# Patient Record
Sex: Male | Born: 1995 | Race: White | Hispanic: No | Marital: Single
Health system: Southern US, Community
[De-identification: ages and names within clinical notes are randomized; demographics above are authoritative.]

## PROBLEM LIST (undated history)

## (undated) DIAGNOSIS — F1010 Alcohol abuse, uncomplicated: Secondary | ICD-10-CM

## (undated) DIAGNOSIS — F4010 Social phobia, unspecified: Secondary | ICD-10-CM

---
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
## 2016-01-11 ENCOUNTER — Ambulatory Visit (HOSPITAL_COMMUNITY)
Admission: EM | Admit: 2016-01-11 | Discharge: 2016-01-11 | Disposition: A | Discharge status: Home / Self Care | Payer: Medicaid Other | Attending: Family Medicine | Admitting: Family Medicine

## 2016-01-11 ENCOUNTER — Encounter (HOSPITAL_COMMUNITY): Payer: Self-pay | Admitting: Emergency Medicine

## 2016-01-11 DIAGNOSIS — H6983 Other specified disorders of Eustachian tube, bilateral: Secondary | ICD-10-CM | POA: Insufficient documentation

## 2016-01-11 DIAGNOSIS — J029 Acute pharyngitis, unspecified: Secondary | ICD-10-CM | POA: Insufficient documentation

## 2016-01-11 DIAGNOSIS — T700XXA Otitic barotrauma, initial encounter: Secondary | ICD-10-CM | POA: Diagnosis not present

## 2016-01-11 LAB — POCT RAPID STREP A: STREPTOCOCCUS, GROUP A SCREEN (DIRECT): NEGATIVE

## 2016-01-11 MED ORDER — AMOXICILLIN 500 MG PO CAPS: 1000.0000 mg | ORAL_CAPSULE | Freq: Two times a day (BID) | ORAL | 0 refills | Status: AC

## 2016-01-11 NOTE — ED Provider Notes (Signed)
CSN: [id number]     Arrival date & time 01/11/16  1951 History   None    Chief Complaint  Patient presents with  . Sore Throat   (Consider location/radiation/quality/duration/timing/severity/associated sxs/prior Treatment) 20 year old male brought in by the mother complaining of a sore throat for one week. He states after first with 3 days it was getting better until the last day and a half then the throat pain got much worse. He was swallowing and there is pain to the left anterior neck that radiates to the side of the face into the ear. This is worse when opening the mouth or chewing. He has had a low-grade fever. He is also complaining of epistaxis. He has had 4 episodes of nosebleeds in the past several weeks. He had one this week.      History reviewed. No pertinent past medical history. History reviewed. No pertinent surgical history. No family history on file. Social History  Substance Use Topics  . Smoking status: Never Smoker  . Smokeless tobacco: Never Used  . Alcohol use No    Review of Systems  Constitutional: Positive for activity change and fever.  HENT: Positive for congestion, ear pain, nosebleeds, sore throat and trouble swallowing.   Eyes: Negative.   Respiratory: Negative.   Cardiovascular: Negative for chest pain.  Gastrointestinal: Negative.   Skin: Negative.   Neurological: Negative.   All other systems reviewed and are negative.   Allergies  Review of patient's allergies indicates no known allergies.  Home Medications   Prior to Admission medications   Medication Sig Start Date End Date Taking? Authorizing Provider  acetaminophen (TYLENOL) 325 MG tablet Take 325 mg by mouth every 6 (six) hours as needed.    Historical Provider, MD  amoxicillin (AMOXIL) 500 MG capsule Take 2 capsules (1,000 mg total) by mouth 2 (two) times daily. 01/11/16   Hayden Rasmussen, NP  ibuprofen (ADVIL,MOTRIN) 200 MG tablet Take 200 mg by mouth every 6 (six) hours as needed.     Historical Provider, MD   Meds Ordered and Administered this Visit  Medications - No data to display  BP 151/75 (BP Location: Left Arm)   Pulse 78   Temp 99.3 F (37.4 C) (Oral)   Resp 16   SpO2 100%  No data found.   Physical Exam  Constitutional: He is oriented to person, place, and time. He appears well-developed and well-nourished. No distress.  HENT:  Head: Normocephalic and atraumatic.  Oropharynx with generalized deep erythema. Few small gray exudates.  Bilateral TMs retracted. No erythema or effusion.  Bilateral nasal turbinates are boggy and erythematous swollen. No site for bleeding.  Eyes: EOM are normal.  Neck: Normal range of motion. Neck supple.  Cardiovascular: Normal rate and regular rhythm.   Pulmonary/Chest: Effort normal.  Musculoskeletal: Normal range of motion. He exhibits no edema or deformity.  Lymphadenopathy:    He has cervical adenopathy.  Neurological: He is alert and oriented to person, place, and time.  Skin: Skin is warm and dry.  Psychiatric: He has a normal mood and affect.  Nursing note and vitals reviewed.   Urgent Care Course   Clinical Course    Procedures (including critical care time)  Labs Review Labs Reviewed  POCT RAPID STREP A    Imaging Review No results found.   Visual Acuity Review  Right Eye Distance:   Left Eye Distance:   Bilateral Distance:    Right Eye Near:   Left Eye Near:  Bilateral Near:         MDM   1. Pharyngitis   2. Barotitis media, initial encounter   3. ETD (eustachian tube dysfunction), bilateral    Use copious amounts of saline nasal spray frequently. To prevent nose bleeds use Neosynephrine 1% nasal spray 4 times a day for 3-4 days and for nose bleeds. Drink lots of fluids Sudafed PE 10 mg every 4 hr as needed for congestion. Ibuprofen 600 mg every 6 hours as needed Meds ordered this encounter  Medications  . amoxicillin (AMOXIL) 500 MG capsule    Sig: Take 2 capsules  (1,000 mg total) by mouth 2 (two) times daily.    Dispense:  40 capsule    Refill:  0    Order Specific Question:   Supervising Provider    Answer:   Linna HoffKINDL, JAMES D [[id number]]       Hayden Rasmussenavid Linus Weckerly, NP 01/11/16 2108    Hayden Rasmussenavid Rameses Ou, NP 01/11/16 2109

## 2016-01-11 NOTE — Discharge Instructions (Signed)
Use copious amounts of saline nasal spray frequently. To prevent nose bleeds use Neosynephrine 1% nasal spray 4 times a day for 3-4 days and for nose bleeds. Drink lots of fluids Sudafed PE 10 mg every 4 hr as needed for congestion. Ibuprofen 600 mg every 6 hours as needed

## 2016-01-11 NOTE — ED Triage Notes (Signed)
Pt c/o ST onset 8/26 associated w/odynophagia and left ear pain  Taking OTC ibup/aceta w/n relief.   A&O x4... NAD

## 2016-01-14 LAB — CULTURE, GROUP A STREP (THRC)

## 2017-07-17 ENCOUNTER — Encounter (HOSPITAL_COMMUNITY): Payer: Self-pay

## 2017-07-17 ENCOUNTER — Emergency Department (HOSPITAL_COMMUNITY)
Admission: EM | Admit: 2017-07-17 | Discharge: 2017-07-17 | Disposition: A | Discharge status: Home / Self Care | Payer: Medicaid Other | Attending: Emergency Medicine | Admitting: Emergency Medicine

## 2017-07-17 ENCOUNTER — Emergency Department (HOSPITAL_COMMUNITY): Payer: Medicaid Other

## 2017-07-17 ENCOUNTER — Other Ambulatory Visit: Payer: Self-pay

## 2017-07-17 DIAGNOSIS — W228XXA Striking against or struck by other objects, initial encounter: Secondary | ICD-10-CM | POA: Insufficient documentation

## 2017-07-17 DIAGNOSIS — Y9389 Activity, other specified: Secondary | ICD-10-CM | POA: Diagnosis not present

## 2017-07-17 DIAGNOSIS — Z23 Encounter for immunization: Secondary | ICD-10-CM | POA: Diagnosis not present

## 2017-07-17 DIAGNOSIS — S61012A Laceration without foreign body of left thumb without damage to nail, initial encounter: Secondary | ICD-10-CM | POA: Insufficient documentation

## 2017-07-17 DIAGNOSIS — Y998 Other external cause status: Secondary | ICD-10-CM | POA: Diagnosis not present

## 2017-07-17 DIAGNOSIS — Y929 Unspecified place or not applicable: Secondary | ICD-10-CM | POA: Diagnosis not present

## 2017-07-17 DIAGNOSIS — S60419A Abrasion of unspecified finger, initial encounter: Secondary | ICD-10-CM

## 2017-07-17 MED ORDER — TETANUS-DIPHTH-ACELL PERTUSSIS 5-2.5-18.5 LF-MCG/0.5 IM SUSP
0.5000 mL | Freq: Once | INTRAMUSCULAR | Status: AC
  Administered 2017-07-17: 0.5 mL via INTRAMUSCULAR
  Filled 2017-07-17: qty 0.5

## 2017-07-17 NOTE — Discharge Instructions (Signed)
Keep the wound clean and dry for the first 24 hours. You may shower but do not immerse the wound in water.  Make sure to pat dry the wound before covering it with any dressing. As we discussed, the glue will fall off on its own. You can use topical antibiotic ointment and bandage. Ice and elevate for pain relief.   You can take Tylenol or Ibuprofen as directed for pain. You can alternate Tylenol and Ibuprofen every 4 hours for additional pain relief.   When you return to work, wear gloves for the next few days.   Monitor closely for any signs of infection. Return to the Emergency Department for any worsening redness/swelling of the area that begins to spread, drainage from the site, worsening pain, fever or any other worsening or concerning symptoms.

## 2017-07-17 NOTE — ED Provider Notes (Addendum)
[facility]-EMERGENCY DEPT Provider Note   CSN: [id number] Arrival date & time: 07/17/17  1721     History   Chief Complaint Chief Complaint  Patient presents with  . Laceration    HPI Jeffery Wallace is a 22 y.o. male who presents for evaluation of injury to the left thumb that occurred just prior to arrival.  Patient reports that he was fixing a tire when the piece that he was using to plug the tire fell and landed on his left thumb.  He reports a small laceration noted to the distal aspect of the thumb.  He does not know when his last tetanus shot was.  He denies any numbness/weakness.  The history is provided by the patient.    History reviewed. No pertinent past medical history.  There are no active problems to display for this patient.   History reviewed. No pertinent surgical history.     Home Medications    Prior to Admission medications   Medication Sig Start Date End Date Taking? Authorizing Provider  amoxicillin (AMOXIL) 500 MG capsule Take 2 capsules (1,000 mg total) by mouth 2 (two) times daily. 01/11/16   Hayden RasmussenMabe, David, NP    Family History Family History  Problem Relation Age of Onset  . Heart murmur Mother   . Scoliosis Mother     Social History Social History   Tobacco Use  . Smoking status: Never Smoker  . Smokeless tobacco: Never Used  Substance Use Topics  . Alcohol use: No  . Drug use: No     Allergies   Patient has no known allergies.   Review of Systems Review of Systems  Skin: Positive for wound.  Neurological: Negative for weakness and numbness.     Physical Exam Updated Vital Signs BP 137/68 (BP Location: Right Arm)   Pulse 71   Temp 98.4 F (36.9 C) (Oral)   Resp 14   Ht 6\' 2"  (1.88 m)   Wt 108.9 kg (240 lb)   SpO2 98%   BMI 30.81 kg/m   Physical Exam  Constitutional: He appears well-developed and well-nourished.  HENT:  Head: Normocephalic and atraumatic.  Eyes: Conjunctivae and EOM are  normal. Right eye exhibits no discharge. Left eye exhibits no discharge. No scleral icterus.  Cardiovascular:  Pulses:      Radial pulses are 2+ on the right side, and 2+ on the left side.  Pulmonary/Chest: Effort normal.  Musculoskeletal:  Mild tenderness to palpation to the distal left thumb.  No deformity or crepitus noted.  Abduction and abduction of the left thumb intact without any difficulty.  Full range of motion of all 5 digits of left hand intact without any difficulty.  No tenderness palpation of the left wrist.  Neurological: He is alert.  Skin: Skin is warm and dry. Capillary refill takes less than 2 seconds.  Facial skin abrasion versus avulsion to the distal aspect of the left thumb.  No damage to left nail. Good distal cap refill. LUE is not dusky in appearance or cool to touch.  Psychiatric: He has a normal mood and affect. His speech is normal and behavior is normal.  Nursing note and vitals reviewed.    ED Treatments / Results  Labs (all labs ordered are listed, but only abnormal results are displayed) Labs Reviewed - No data to display  EKG  EKG Interpretation None       Radiology Dg Hand Complete Left  Result Date: 07/17/2017 CLINICAL DATA:  Left thumb laceration.  Initial encounter. EXAM: LEFT HAND - COMPLETE 3+ VIEW COMPARISON:  None. FINDINGS: Thumb laceration at the tip. Negative for opaque foreign body or fracture. Normal alignment. IMPRESSION: Negative for opaque foreign body or fracture. Electronically Signed   By: Marnee Spring M.D.   On: 07/17/2017 18:56    Procedures .Marland KitchenLaceration Repair Date/Time: 07/17/2017 8:15 PM Performed by: Maxwell Caul, PA-C Authorized by: Maxwell Caul, PA-C   Consent:    Consent obtained:  Verbal   Consent given by:  Patient   Risks discussed:  Infection, pain and poor wound healing Anesthesia (see MAR for exact dosages):    Anesthesia method:  None Laceration details:    Location:  Finger   Finger  location:  L thumb Repair type:    Repair type:  Simple Pre-procedure details:    Preparation:  Patient was prepped and draped in usual sterile fashion Treatment:    Area cleansed with:  Saline   Amount of cleaning:  Extensive   Irrigation solution:  Sterile saline   Irrigation method:  Syringe Skin repair:    Repair method:  Tissue adhesive Approximation:    Approximation:  Close   Vermilion border: well-aligned       Patient with a 1 cm abrasion/skin avulsion. Repaired with skin glue.    (including critical care time)  Medications Ordered in ED Medications  Tdap (BOOSTRIX) injection 0.5 mL (0.5 mLs Intramuscular Given 07/17/17 1935)     Initial Impression / Assessment and Plan / ED Course  I have reviewed the triage vital signs and the nursing notes.  Pertinent labs & imaging results that were available during my care of the patient were reviewed by me and considered in my medical decision making (see chart for details).     22 year old male who presents for evaluation of left thumb injury that occurred earlier this afternoon.  States that he was changing a tire when a piece of the tire machinery, fell and hit his thumb.  Tetanus is not up-to-date. Patient is afebrile, non-toxic appearing, sitting comfortably on examination table. Vital signs reviewed and stable. Patient is neurovascularly intact.  He initially appear to have a small ulcer, linear laceration noted to the distal aspect of the left thumb.  After wound cleaning, this appeared to be more of a abrasion/avulsion of the superficial layer of the skin.  After thorough wound clinic, did not appear to extend deeply.  Will plan for x-ray evaluation.  X-ray reviewed.  Negative for any acute fracture dislocation.  Discussed results with patient.  Discussed treatment options, including Dermabond versus clipping the avulsed skin.  Patient opted for Dermabond.  Dermabond applied without any difficulty.  Wound care precautions  discussed with patient. Patient had ample opportunity for questions and discussion. All patient's questions were answered with full understanding. Strict return precautions discussed. Patient expresses understanding and agreement to plan.    Final Clinical Impressions(s) / ED Diagnoses   Final diagnoses:  Abrasion of finger, initial encounter    ED Discharge Orders    None       Rosana Hoes 07/17/17 2047    Bethann Berkshire, MD 07/17/17 2246    Maxwell Caul, PA-C 07/30/17 1546    Bethann Berkshire, MD 07/30/17 2108

## 2017-07-17 NOTE — ED Notes (Signed)
Pt's wound wrapped with telfa, then covered with coband

## 2017-07-17 NOTE — ED Triage Notes (Signed)
Patient cut his left thumb trying to plug a tire.

## 2018-05-11 ENCOUNTER — Emergency Department (HOSPITAL_COMMUNITY): Payer: Medicaid Other

## 2018-05-11 ENCOUNTER — Inpatient Hospital Stay (HOSPITAL_COMMUNITY)
Admission: EM | Admit: 2018-05-11 | Discharge: 2018-05-14 | DRG: 511 | Disposition: A | Discharge status: Home Health | Payer: Medicaid Other | Attending: Orthopedic Surgery | Admitting: Orthopedic Surgery

## 2018-05-11 DIAGNOSIS — S52612A Displaced fracture of left ulna styloid process, initial encounter for closed fracture: Secondary | ICD-10-CM | POA: Diagnosis present

## 2018-05-11 DIAGNOSIS — S2242XA Multiple fractures of ribs, left side, initial encounter for closed fracture: Secondary | ICD-10-CM | POA: Diagnosis present

## 2018-05-11 DIAGNOSIS — Y92410 Unspecified street and highway as the place of occurrence of the external cause: Secondary | ICD-10-CM | POA: Diagnosis not present

## 2018-05-11 DIAGNOSIS — J984 Other disorders of lung: Secondary | ICD-10-CM | POA: Diagnosis present

## 2018-05-11 DIAGNOSIS — S62022A Displaced fracture of middle third of navicular [scaphoid] bone of left wrist, initial encounter for closed fracture: Secondary | ICD-10-CM | POA: Diagnosis present

## 2018-05-11 DIAGNOSIS — M25532 Pain in left wrist: Secondary | ICD-10-CM | POA: Diagnosis present

## 2018-05-11 DIAGNOSIS — T148XXA Other injury of unspecified body region, initial encounter: Secondary | ICD-10-CM

## 2018-05-11 DIAGNOSIS — S2239XA Fracture of one rib, unspecified side, initial encounter for closed fracture: Secondary | ICD-10-CM | POA: Diagnosis present

## 2018-05-11 DIAGNOSIS — S52502A Unspecified fracture of the lower end of left radius, initial encounter for closed fracture: Principal | ICD-10-CM | POA: Diagnosis present

## 2018-05-11 DIAGNOSIS — S62002A Unspecified fracture of navicular [scaphoid] bone of left wrist, initial encounter for closed fracture: Secondary | ICD-10-CM

## 2018-05-11 LAB — I-STAT CHEM 8, ED
BUN: 16 mg/dL (ref 6–20)
Calcium, Ion: 1.13 mmol/L — ABNORMAL LOW (ref 1.15–1.40)
Chloride: 101 mmol/L (ref 98–111)
Creatinine, Ser: 1.2 mg/dL (ref 0.61–1.24)
GLUCOSE: 104 mg/dL — AB (ref 70–99)
HCT: 48 % (ref 39.0–52.0)
Hemoglobin: 16.3 g/dL (ref 13.0–17.0)
Potassium: 3.9 mmol/L (ref 3.5–5.1)
Sodium: 137 mmol/L (ref 135–145)
TCO2: 25 mmol/L (ref 22–32)

## 2018-05-11 LAB — CBC
HCT: 47.7 % (ref 39.0–52.0)
Hemoglobin: 16.2 g/dL (ref 13.0–17.0)
MCH: 28.8 pg (ref 26.0–34.0)
MCHC: 34 g/dL (ref 30.0–36.0)
MCV: 84.7 fL (ref 80.0–100.0)
Platelets: 286 10*3/uL (ref 150–400)
RBC: 5.63 MIL/uL (ref 4.22–5.81)
RDW: 12 % (ref 11.5–15.5)
WBC: 10.5 10*3/uL (ref 4.0–10.5)
nRBC: 0 % (ref 0.0–0.2)

## 2018-05-11 LAB — COMPREHENSIVE METABOLIC PANEL
ALT: 21 U/L (ref 0–44)
AST: 32 U/L (ref 15–41)
Albumin: 5.2 g/dL — ABNORMAL HIGH (ref 3.5–5.0)
Alkaline Phosphatase: 71 U/L (ref 38–126)
Anion gap: 13 (ref 5–15)
BUN: 14 mg/dL (ref 6–20)
CO2: 25 mmol/L (ref 22–32)
Calcium: 10.1 mg/dL (ref 8.9–10.3)
Chloride: 100 mmol/L (ref 98–111)
Creatinine, Ser: 1.28 mg/dL — ABNORMAL HIGH (ref 0.61–1.24)
GFR calc Af Amer: >60 mL/min (ref >60)
GFR calc non Af Amer: >60 mL/min (ref >60)
Glucose, Bld: 110 mg/dL — ABNORMAL HIGH (ref 70–99)
Potassium: 4 mmol/L (ref 3.5–5.1)
Sodium: 138 mmol/L (ref 135–145)
Total Bilirubin: 0.7 mg/dL (ref 0.3–1.2)
Total Protein: 7.8 g/dL (ref 6.5–8.1)

## 2018-05-11 LAB — PROTIME-INR
INR: 1.13
Prothrombin Time: 14.4 seconds (ref 11.4–15.2)

## 2018-05-11 LAB — CDS SEROLOGY

## 2018-05-11 LAB — SAMPLE TO BLOOD BANK

## 2018-05-11 LAB — I-STAT CG4 LACTIC ACID, ED: Lactic Acid, Venous: 2.62 mmol/L (ref 0.5–1.9)

## 2018-05-11 LAB — ETHANOL: Alcohol, Ethyl (B): <10 mg/dL (ref <10)

## 2018-05-11 MED ORDER — HYDROMORPHONE HCL 1 MG/ML IJ SOLN: 1.0000 mg | INTRAMUSCULAR | Status: DC | PRN

## 2018-05-11 MED ORDER — KCL IN DEXTROSE-NACL 20-5-0.9 MEQ/L-%-% IV SOLN
INTRAVENOUS | Status: DC
  Administered 2018-05-12 – 2018-05-13 (×3): via INTRAVENOUS
  Filled 2018-05-11 (×3): qty 1000

## 2018-05-11 MED ORDER — ENOXAPARIN SODIUM 40 MG/0.4ML ~~LOC~~ SOLN
40.0000 mg | SUBCUTANEOUS | Status: DC
  Administered 2018-05-12 – 2018-05-14 (×3): 40 mg via SUBCUTANEOUS
  Filled 2018-05-11 (×3): qty 0.4

## 2018-05-11 MED ORDER — TETANUS-DIPHTH-ACELL PERTUSSIS 5-2.5-18.5 LF-MCG/0.5 IM SUSP
0.5000 mL | Freq: Once | INTRAMUSCULAR | Status: AC
  Administered 2018-05-11: 0.5 mL via INTRAMUSCULAR

## 2018-05-11 MED ORDER — ONDANSETRON HCL 4 MG/2ML IJ SOLN: 4.0000 mg | Freq: Four times a day (QID) | INTRAMUSCULAR | Status: DC | PRN

## 2018-05-11 MED ORDER — LORAZEPAM 2 MG/ML IJ SOLN
INTRAMUSCULAR | Status: AC
  Filled 2018-05-11: qty 1

## 2018-05-11 MED ORDER — IOHEXOL 300 MG/ML  SOLN
100.0000 mL | Freq: Once | INTRAMUSCULAR | Status: AC | PRN
  Administered 2018-05-11: 100 mL via INTRAVENOUS

## 2018-05-11 MED ORDER — OXYCODONE HCL 5 MG PO TABS
5.0000 mg | ORAL_TABLET | ORAL | Status: DC | PRN
  Administered 2018-05-12 – 2018-05-13 (×3): 5 mg via ORAL
  Filled 2018-05-11 (×3): qty 1

## 2018-05-11 MED ORDER — LORAZEPAM 2 MG/ML IJ SOLN
1.0000 mg | Freq: Once | INTRAMUSCULAR | Status: AC
  Administered 2018-05-11: 1 mg via INTRAVENOUS

## 2018-05-11 MED ORDER — HYDRALAZINE HCL 20 MG/ML IJ SOLN: 10.0000 mg | INTRAMUSCULAR | Status: DC | PRN

## 2018-05-11 MED ORDER — TETANUS-DIPHTH-ACELL PERTUSSIS 5-2.5-18.5 LF-MCG/0.5 IM SUSP
INTRAMUSCULAR | Status: AC
  Filled 2018-05-11: qty 0.5

## 2018-05-11 MED ORDER — ONDANSETRON 4 MG PO TBDP: 4.0000 mg | ORAL_TABLET | Freq: Four times a day (QID) | ORAL | Status: DC | PRN

## 2018-05-11 MED ORDER — SODIUM CHLORIDE 0.9 % IV BOLUS
1000.0000 mL | Freq: Once | INTRAVENOUS | Status: AC
  Administered 2018-05-11: 1000 mL via INTRAVENOUS

## 2018-05-11 NOTE — Progress Notes (Signed)
   05/11/18 1900  Clinical Encounter Type  Visited With Patient and family together;Patient;Health care provider  Visit Type Initial;Social support;Psychological support;ED;Trauma  Referral From Other (Comment) (level 2 trauma lplg)  Recommendations could use support for social anxiety  Spiritual Encounters  Spiritual Needs Emotional;Grief support  Stress Factors  Patient Stress Factors Loss of control;Major life changes;Family relationships;Financial concerns;Health changes   Met pt, at his request helped him use his cell phone to call his grandmother, Bethena Roys.  She answered and spoke w/ pt, knows he is here.    Pt is expressing numerous worries and concerns, from past difficult situations (including his mother's death in hospital 2 years ago) to financial concerns, worries about his older brother's problems, his great-grandmother, previous experiences in his teenage years.  He also states that he "barely leaves" his home, "doesn't have any friends," and that he has "social anxiety."  Chaplain stayed w/ pt until just after his brother arrived, about an hour.  Pt very self-critical.  Pt lives w/ and cares for his 65 yo great-grandmother.  He states he dropped out of school at age 63.    Pt's brother arrived.  Chaplain referred questions he had to the medical staff.  [name] resident, [phone]
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
## 2018-05-11 NOTE — ED Notes (Signed)
Pt given 100 mcg fentanyl, left arm splinted at the wrist. Abrasions noted to the right arm and elbow.  No abdominal tenderness or rigidity.

## 2018-05-11 NOTE — ED Notes (Signed)
Lindsay(Phleb) notified Dr. Clarene DukeLittle of critical lab result
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
## 2018-05-11 NOTE — Anesthesia Preprocedure Evaluation (Addendum)
Anesthesia Evaluation  Patient identified by MRN, date of birth, ID band Patient awake    Reviewed: Allergy & Precautions, H&P , NPO status , Patient's Chart, lab work & pertinent test results  Airway Mallampati: II  TM Distance: >3 FB Neck ROM: Full    Dental no notable dental hx. (+) Teeth Intact, Dental Advisory Given   Pulmonary neg pulmonary ROS,    Pulmonary exam normal breath sounds clear to auscultation       Cardiovascular negative cardio ROS   Rhythm:Regular Rate:Normal     Neuro/Psych negative neurological ROS  negative psych ROS   GI/Hepatic negative GI ROS, Neg liver ROS,   Endo/Other  negative endocrine ROS  Renal/GU negative Renal ROS  negative genitourinary   Musculoskeletal   Abdominal   Peds  Hematology negative hematology ROS (+)   Anesthesia Other Findings   Reproductive/Obstetrics negative OB ROS                             Anesthesia Physical Anesthesia Plan  ASA: I  Anesthesia Plan: General   Post-op Pain Management:    Induction: Intravenous, Rapid sequence and Cricoid pressure planned  PONV Risk Score and Plan: 3 and Ondansetron, Dexamethasone and Midazolam  Airway Management Planned: Oral ETT  Additional Equipment:   Intra-op Plan:   Post-operative Plan: Extubation in OR  Informed Consent: I have reviewed the patients History and Physical, chart, labs and discussed the procedure including the risks, benefits and alternatives for the proposed anesthesia with the patient or authorized representative who has indicated his/her understanding and acceptance.     Dental advisory given  Plan Discussed with: CRNA  Anesthesia Plan Comments:         Anesthesia Quick Evaluation  

## 2018-05-12 ENCOUNTER — Inpatient Hospital Stay (HOSPITAL_COMMUNITY): Payer: Medicaid Other | Admitting: Anesthesiology

## 2018-05-12 ENCOUNTER — Encounter (HOSPITAL_COMMUNITY): Admission: EM | Disposition: A | Discharge status: Home Health | Payer: Self-pay | Source: Home / Self Care

## 2018-05-12 ENCOUNTER — Inpatient Hospital Stay (HOSPITAL_COMMUNITY): Payer: Medicaid Other

## 2018-05-12 HISTORY — PX: ORIF RADIAL FRACTURE: SHX5113

## 2018-05-12 LAB — HIV ANTIBODY (ROUTINE TESTING W REFLEX): HIV Screen 4th Generation wRfx: NONREACTIVE

## 2018-05-12 SURGERY — OPEN REDUCTION INTERNAL FIXATION (ORIF) RADIAL FRACTURE
Anesthesia: General | Site: Hand | Laterality: Left

## 2018-05-12 MED ORDER — MIDAZOLAM HCL 5 MG/5ML IJ SOLN
INTRAMUSCULAR | Status: DC | PRN
  Administered 2018-05-12: 2 mg via INTRAVENOUS

## 2018-05-12 MED ORDER — LIDOCAINE 2% (20 MG/ML) 5 ML SYRINGE
INTRAMUSCULAR | Status: DC | PRN
  Administered 2018-05-12: 60 mg via INTRAVENOUS

## 2018-05-12 MED ORDER — FENTANYL CITRATE (PF) 100 MCG/2ML IJ SOLN
INTRAMUSCULAR | Status: DC | PRN
  Administered 2018-05-12 (×5): 50 ug via INTRAVENOUS

## 2018-05-12 MED ORDER — SUCCINYLCHOLINE CHLORIDE 200 MG/10ML IV SOSY
PREFILLED_SYRINGE | INTRAVENOUS | Status: DC | PRN
  Administered 2018-05-12: 100 mg via INTRAVENOUS

## 2018-05-12 MED ORDER — PROPOFOL 10 MG/ML IV BOLUS: INTRAVENOUS | Status: DC | PRN

## 2018-05-12 MED ORDER — BUPIVACAINE HCL (PF) 0.25 % IJ SOLN
INTRAMUSCULAR | Status: DC | PRN
  Administered 2018-05-12: 10 mL

## 2018-05-12 MED ORDER — ACETAMINOPHEN 500 MG PO TABS: 1000.0000 mg | ORAL_TABLET | Freq: Four times a day (QID) | ORAL | Status: AC | PRN

## 2018-05-12 MED ORDER — LIDOCAINE 2% (20 MG/ML) 5 ML SYRINGE: INTRAMUSCULAR | Status: DC | PRN

## 2018-05-12 MED ORDER — ACETAMINOPHEN 325 MG PO TABS: 650.0000 mg | ORAL_TABLET | Freq: Four times a day (QID) | ORAL | Status: DC

## 2018-05-12 MED ORDER — ONDANSETRON HCL 4 MG/2ML IJ SOLN
INTRAMUSCULAR | Status: DC | PRN
  Administered 2018-05-12: 4 mg via INTRAVENOUS

## 2018-05-12 MED ORDER — FENTANYL CITRATE (PF) 250 MCG/5ML IJ SOLN
INTRAMUSCULAR | Status: AC
  Filled 2018-05-12: qty 5

## 2018-05-12 MED ORDER — ACETAMINOPHEN 10 MG/ML IV SOLN
INTRAVENOUS | Status: DC | PRN
  Administered 2018-05-12: 1000 mg via INTRAVENOUS

## 2018-05-12 MED ORDER — OXYCODONE HCL 5 MG PO TABS: 5.0000 mg | ORAL_TABLET | ORAL | 0 refills | Status: AC | PRN

## 2018-05-12 MED ORDER — PROPOFOL 10 MG/ML IV BOLUS
INTRAVENOUS | Status: DC | PRN
  Administered 2018-05-12: 200 mg via INTRAVENOUS

## 2018-05-12 MED ORDER — ONDANSETRON HCL 4 MG/2ML IJ SOLN
INTRAMUSCULAR | Status: AC
  Filled 2018-05-12: qty 2

## 2018-05-12 MED ORDER — LIDOCAINE HCL (PF) 1 % IJ SOLN
INTRAMUSCULAR | Status: AC
  Filled 2018-05-12: qty 30

## 2018-05-12 MED ORDER — CEFAZOLIN SODIUM-DEXTROSE 2-3 GM-%(50ML) IV SOLR
INTRAVENOUS | Status: DC | PRN
  Administered 2018-05-12: 2 g via INTRAVENOUS

## 2018-05-12 MED ORDER — ACETAMINOPHEN 10 MG/ML IV SOLN
INTRAVENOUS | Status: AC
  Filled 2018-05-12: qty 100

## 2018-05-12 MED ORDER — MIDAZOLAM HCL 2 MG/2ML IJ SOLN
INTRAMUSCULAR | Status: AC
  Filled 2018-05-12: qty 2

## 2018-05-12 MED ORDER — LIDOCAINE 2% (20 MG/ML) 5 ML SYRINGE
INTRAMUSCULAR | Status: AC
  Filled 2018-05-12: qty 5

## 2018-05-12 MED ORDER — LACTATED RINGERS IV SOLN
INTRAVENOUS | Status: DC | PRN
  Administered 2018-05-12 (×2): via INTRAVENOUS

## 2018-05-12 MED ORDER — DEXAMETHASONE SODIUM PHOSPHATE 10 MG/ML IJ SOLN
INTRAMUSCULAR | Status: DC | PRN
  Administered 2018-05-12: 10 mg via INTRAVENOUS

## 2018-05-12 MED ORDER — OXYCODONE HCL 5 MG PO TABS: 0.0000 mg | ORAL_TABLET | Freq: Four times a day (QID) | ORAL | 0 refills | Status: DC | PRN

## 2018-05-12 MED ORDER — DEXAMETHASONE SODIUM PHOSPHATE 10 MG/ML IJ SOLN
INTRAMUSCULAR | Status: AC
  Filled 2018-05-12: qty 1

## 2018-05-12 MED ORDER — PROPOFOL 10 MG/ML IV BOLUS
INTRAVENOUS | Status: AC
  Filled 2018-05-12: qty 20

## 2018-05-12 MED ORDER — BUPIVACAINE HCL (PF) 0.25 % IJ SOLN
INTRAMUSCULAR | Status: AC
  Filled 2018-05-12: qty 30

## 2018-05-12 MED ORDER — 0.9 % SODIUM CHLORIDE (POUR BTL) OPTIME
TOPICAL | Status: DC | PRN
  Administered 2018-05-12: 1000 mL

## 2018-05-12 MED ORDER — LIDOCAINE HCL (PF) 1 % IJ SOLN: INTRAMUSCULAR | Status: DC | PRN

## 2018-05-12 MED ORDER — SUCCINYLCHOLINE CHLORIDE 200 MG/10ML IV SOSY
PREFILLED_SYRINGE | INTRAVENOUS | Status: AC
  Filled 2018-05-12: qty 10

## 2018-05-12 SURGICAL SUPPLY — 56 items
BANDAGE ELASTIC 4 VELCRO ST LF (GAUZE/BANDAGES/DRESSINGS) ×2 IMPLANT
BIT DRILL CANN 2.4 (BIT) ×2
BIT DRILL CANN 2.4MM (BIT) ×1
BIT DRILL CANN MAX VPC 2.4 (BIT) IMPLANT
BLADE SURG 15 STRL LF DISP TIS (BLADE) ×1 IMPLANT
BLADE SURG 15 STRL SS (BLADE) ×2
BNDG COHESIVE 4X5 TAN STRL (GAUZE/BANDAGES/DRESSINGS) ×3 IMPLANT
BNDG ESMARK 4X9 LF (GAUZE/BANDAGES/DRESSINGS) ×3 IMPLANT
BNDG GAUZE ELAST 4 BULKY (GAUZE/BANDAGES/DRESSINGS) ×6 IMPLANT
BRUSH SCRUB EZ PLAIN DRY (MISCELLANEOUS) IMPLANT
CANISTER SUCT 3000ML PPV (MISCELLANEOUS) ×3 IMPLANT
CHLORAPREP W/TINT 26ML (MISCELLANEOUS) ×3 IMPLANT
CORDS BIPOLAR (ELECTRODE) ×3 IMPLANT
COVER SURGICAL LIGHT HANDLE (MISCELLANEOUS) ×3 IMPLANT
COVER WAND RF STERILE (DRAPES) ×1 IMPLANT
CUFF TOURNIQUET SINGLE 18IN (TOURNIQUET CUFF) ×2 IMPLANT
CUFF TOURNIQUET SINGLE 24IN (TOURNIQUET CUFF) IMPLANT
DRAPE C-ARM 42X72 X-RAY (DRAPES) ×3 IMPLANT
DRAPE SURG 17X23 STRL (DRAPES) ×3 IMPLANT
DRESSING ADAPTIC 1/2  N-ADH (PACKING) ×2 IMPLANT
DRSG ADAPTIC 3X8 NADH LF (GAUZE/BANDAGES/DRESSINGS) ×3 IMPLANT
DRSG EMULSION OIL 3X3 NADH (GAUZE/BANDAGES/DRESSINGS) IMPLANT
GAUZE SPONGE 4X4 12PLY STRL (GAUZE/BANDAGES/DRESSINGS) ×3 IMPLANT
GAUZE SPONGE 4X4 12PLY STRL LF (GAUZE/BANDAGES/DRESSINGS) ×2 IMPLANT
GLOVE BIO SURGEON STRL SZ7.5 (GLOVE) ×3 IMPLANT
GLOVE BIOGEL PI IND STRL 8 (GLOVE) ×1 IMPLANT
GLOVE BIOGEL PI INDICATOR 8 (GLOVE) ×2
GOWN STRL REUS W/ TWL XL LVL3 (GOWN DISPOSABLE) ×1 IMPLANT
GOWN STRL REUS W/TWL XL LVL3 (GOWN DISPOSABLE) ×2
GUIDEWIRE .045IN 1.14MM (WIRE) ×4 IMPLANT
K-WIRE COCR 1.1X105 (WIRE) ×6
KIT BASIN OR (CUSTOM PROCEDURE TRAY) ×3 IMPLANT
KWIRE COCR 1.1X105 (WIRE) IMPLANT
NDL HYPO 25X1 1.5 SAFETY (NEEDLE) IMPLANT
NEEDLE HYPO 22GX1.5 SAFETY (NEEDLE) ×3 IMPLANT
NEEDLE HYPO 25X1 1.5 SAFETY (NEEDLE) ×3 IMPLANT
NS IRRIG 1000ML POUR BTL (IV SOLUTION) ×3 IMPLANT
PACK ORTHO EXTREMITY (CUSTOM PROCEDURE TRAY) ×3 IMPLANT
PAD CAST 3X4 CTTN HI CHSV (CAST SUPPLIES) IMPLANT
PAD CAST 4YDX4 CTTN HI CHSV (CAST SUPPLIES) ×1 IMPLANT
PADDING CAST ABS 4INX4YD NS (CAST SUPPLIES)
PADDING CAST ABS COTTON 4X4 ST (CAST SUPPLIES) IMPLANT
PADDING CAST COTTON 3X4 STRL (CAST SUPPLIES) ×2
PADDING CAST COTTON 4X4 STRL (CAST SUPPLIES) ×2
RUBBERBAND STERILE (MISCELLANEOUS) IMPLANT
SCREW CANN MAX VPC 3.4X18 (Screw) IMPLANT
SCREW VPS 3.4X18MM (Screw) ×2 IMPLANT
SLING ARM FOAM STRAP MED (SOFTGOODS) ×2 IMPLANT
SUT VIC AB 2-0 CT3 27 (SUTURE) ×3 IMPLANT
SUT VICRYL 4-0 PS2 18IN ABS (SUTURE) IMPLANT
SUT VICRYL RAPIDE 4/0 PS 2 (SUTURE) ×3 IMPLANT
SYR 10ML LL (SYRINGE) ×2 IMPLANT
TOWEL OR 17X24 6PK STRL BLUE (TOWEL DISPOSABLE) ×3 IMPLANT
TUBE CONNECTING 12'X1/4 (SUCTIONS) ×1
TUBE CONNECTING 12X1/4 (SUCTIONS) ×2 IMPLANT
UNDERPAD 30X30 (UNDERPADS AND DIAPERS) ×3 IMPLANT

## 2018-05-12 NOTE — Op Note (Signed)
05/11/2018 - 05/12/2018  1:03 AM  PATIENT:  Jeffery Wallace  23 y.o. male  PRE-OPERATIVE DIAGNOSIS: Displaced left scaphoid fracture and nondisplaced distal radius fracture  POST-OPERATIVE DIAGNOSIS:  Same  PROCEDURE:   1.  ORIF left scaphoid fracture    2.  Percutaneous pinning left distal radius fracture  SURGEON: Cliffton Asters. Janee Morn, MD  PHYSICIAN ASSISTANT: None  ANESTHESIA:  general  SPECIMENS:  None  DRAINS:   None  EBL:  less than 50 mL  PREOPERATIVE INDICATIONS:  Jeffery L Getchell is a  23 y.o. male with left upper extremity injuries, including those above.  The risks benefits and alternatives were discussed with the patient preoperatively including but not limited to the risks of infection, bleeding, nerve injury, cardiopulmonary complications, the need for revision surgery, among others, and the patient verbalized understanding and consented to proceed.  OPERATIVE IMPLANTS: Biomet VPC 3.5 mm headless compression screw, 18 mm, and 0.045 inch K wires x2  OPERATIVE PROCEDURE:  After receiving prophylactic antibiotics, the patient was escorted to the operative theatre and placed in a supine position.  General anesthesia was administered.  A surgical "time-out" was performed during which the planned procedure, proposed operative site, and the correct patient identity were compared to the operative consent and agreement confirmed by the circulating nurse according to current facility policy.  Following application of a tourniquet to the operative extremity, the exposed skin was pre-scrubbed with a Hibiclens scrub brush before being formally prepped with Chloraprep and draped in the usual sterile fashion.  The limb was exsanguinated with an Esmarch bandage and the tourniquet inflated to approximately higher than systolic BP.  Using fluoroscopic guidance, the entry point was established radiographically and a small skin incision about 5 mm was made, just proximal to the  scapholunate interval.  Spreading dissection was carried down to the level of the capsule, and a 16-gauge hypodermic needle used to establish the appropriate starting point.  This was confirmed fluoroscopically.  The guidepin for the headless compression screw was then placed, using the hypodermic needle to help guide it.  Once advanced to the distal end of the scaphoid, the depth gauge was used to establish the appropriate length, then the guidepin was advanced distally out the skin so that it could be grasped on that and if lost or broken.  Cannulated drill was then used to overdrilled the K wire and the screw, 18 mm, was placed.  In the end, it was judged to be acceptable in length, but on the shorter side of what could be tolerated.  It did seem to offer good compression across the fracture site.  2 different oblique percutaneously placed K wires were then driven across the distal radius, one from the radial side and one dorsally to secure fixation across the radius fracture.  The DRUJ was tested and found to have sufficient stability to not mandate treatment of the ulnar styloid fracture.  Final images were obtained, and the tourniquet was released.  The wound was irrigated and closed with 4-0 Vicryl Rapide and erupted sutures.  Half percent Marcaine with epinephrine was instilled both in the radiocarpal joint as well as the pin sites and incision to help with postoperative pain control.  A short arm thumb spica splint dressing was applied and he was awakened taken to the recovery room in stable condition, breathing spontaneously.  DISPOSITION: He will be admitted overnight to the trauma service on account of his rib fractures.  He will need to follow-up with  me in 2 weeks, at which time he will get new x-rays of the left wrist out of the splint, including a scaphoid view, and transition to a short arm thumb spica cast.

## 2018-05-12 NOTE — Plan of Care (Signed)
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
## 2018-05-12 NOTE — Discharge Instructions (Addendum)
Discharge Instructions   You have a dressing with a plaster splint incorporated in it. Move your fingers as much as possible, making a full fist and fully opening the fist.  No lifting with the left hand greater than paper/pencil tasks. Elevate your hand to reduce pain & swelling of the digits.  Ice over the operative site may be helpful to reduce pain & swelling.  DO NOT USE HEAT. Pain medicine has been prescribed for you.  Leave the dressing in place until you return to our office.  You may shower, but keep the bandage clean & dry.  You may drive a car when you are off of prescription pain medications and can safely control your vehicle with both hands.   Please call [phone] during normal business hours or [phone] after hours for any problems. Including the following:  - excessive redness of the incisions - drainage for more than 4 days - fever of more than 101.5 F  *Please note that pain medications will not be refilled after hours or on weekends.   Acute Compartment Syndrome  Compartment syndrome is a painful condition that occurs when swelling and pressure build up in a body space (compartment) of the arms or legs. Groups of muscles, nerves, and blood vessels in the arms and legs are separated into various compartments. Each compartment is surrounded by tough layers of tissue (fascia). In compartment syndrome, pressure builds up within the layers of fascia and begins to push on the structures within that compartment. In acute compartment syndrome, the pressure builds up suddenly, often as the result of an injury. If pressure continues to increase, it can block the flow of blood in the smallest blood vessels (capillaries). Then the muscles in the compartment cannot get enough oxygen and nutrients and will start to die within 4-6 hours. The nerves will begin to die within 12-24 hours. This condition is a medical emergency that must be treated with surgery. What are the  causes? This condition may be caused by:  Injury. Some injuries can cause swelling or bleeding in a compartment. This can lead to compartment syndrome. Injuries that may cause this problem include: ? Broken bones, especially the long bones of the arms and legs. ? Crushing injuries. ? Penetrating injuries, such as a knife wound. ? Badly bruised muscles. ? Poisonous bites, such as a snake bite. ? Severe burns.  Blocked blood flow. This could be a result of: ? A cast or bandage that is too tight. ? A surgical procedure. Blood flow sometimes has to be stopped for a while during a surgery, usually with a tourniquet. ? Lying for too long in a position that restricts blood flow. This can happen in people who have nerve damage or if a person is unconscious for a long time. ? Medicines used to build up muscles (anabolic steroids). ? Medicines that keep the blood from forming clots (blood thinners). What are the signs or symptoms? The most common symptom of this condition is pain. The pain:  May be far more severe than it should be for the injury you have.  May get worse: ? When moving or stretching the affected body part. ? When the area is pushed or squeezed. ? When raising (elevating) affected body part above the level of the heart.  May come with a feeling of tingling or burning.  May not get better when you take pain medicine. Other symptoms include:  A feeling of tightness or fullness in the affected area.  A  loss of feeling.  Weakness in the area.  Loss of movement.  Skin becoming pale, tight, and shiny over the painful area.  Warmth and tenderness.  Tensing when the affected area is touched. How is this diagnosed? This condition may be diagnosed based on:  Your physical exam and symptoms.  Measuring the pressure in the affected area (compartment pressure measurement).  Tests to rule out other problems, such as: ? X-rays. ? Blood tests. ? Ultrasound. How is this  treated? Treatment for this condition uses a procedure called fasciotomy. In this procedure, incisions are made through the fascia to relieve the pressure in the compartment and to prevent permanent damage. Before the surgery, first-aid treatment is done, which may include:  Treating any injury.  Loosening or removing any cast, bandage, or external wrap that may be causing pain.  Elevating the painful arm or leg to the same level as the heart.  Giving oxygen.  Giving fluids through an IV tube.  Pain medicine. Summary  Compartment syndrome occurs when swelling and pressure build up in a body space (compartment) of the arms or legs.  First aid treatment may include loosening or removing a cast, bandage, or wrap and elevating the painful arm or leg at the level of the heart.  In acute compartment syndrome, the pressure builds up suddenly, often as the result of an injury.  This condition is a medical emergency that must be treated with a surgical procedure called fasciotomy. This procedure relieves the pressure and prevents permanent damage. This information is not intended to replace advice given to you by your health care provider. Make sure you discuss any questions you have with your health care provider. Document Released: 04/16/2009 Document Revised: 04/17/2016 Document Reviewed: 04/17/2016 Elsevier Interactive Patient Education  2019 [employer]vier Inc.    Rib Fracture  A rib fracture is a break or crack in one of the bones of the ribs. The ribs are like a cage that goes around your upper chest. A broken or cracked rib is often painful, but most do not cause other problems. Most rib fractures usually heal on their own in 1-3 months. Follow these instructions at home: Managing pain, stiffness, and swelling  If directed, apply ice to the injured area. ? Put ice in a plastic bag. ? Place a towel between your skin and the bag. ? Leave the ice on for 20 minutes, 2-3 times a  day.  Take over-the-counter and prescription medicines only as told by your doctor. Activity  Avoid activities that cause pain to the injured area. Protect your injured area.  Slowly increase activity as told by your doctor. General instructions  Do deep breathing as told by your doctor. You may be told to: ? Take deep breaths many times a day. ? Cough many times a day while hugging a pillow. ? Use a device (incentive spirometer) to do deep breathing many times a day.  Drink enough fluid to keep your pee (urine) clear or pale yellow.  Do not wear a rib belt or binder. These do not allow you to breathe deeply.  Keep all follow-up visits as told by your doctor. This is important. Contact a doctor if:  You have a fever. Get help right away if:  You have trouble breathing.  You are short of breath.  You cannot stop coughing.  You cough up thick or bloody spit (sputum).  You feel sick to your stomach (nauseous), throw up (vomit), or have belly (abdominal) pain.  Your  pain gets worse and medicine does not help. Summary  A rib fracture is a break or crack in one of the bones of the ribs.  Apply ice to the injured area and take medicines for pain as told by your doctor.  Take deep breaths and cough many times a day. Hug a pillow every time you cough. This information is not intended to replace advice given to you by your health care provider. Make sure you discuss any questions you have with your health care provider. Document Released: 02/05/2008 Document Revised: 07/29/2016 Document Reviewed: 07/29/2016 Elsevier Interactive Patient Education  2019 [employer].   1. PAIN CONTROL:  1. Pain is best controlled by a usual combination of three different methods TOGETHER:  1. Ice/Heat 2. Over the counter pain medication 3. Prescription pain medication 2. Most patients will experience some swelling and bruising around rib fractures. Ice packs or heating pads (30-60 minutes up  to 6 times a day) will help. Use ice for the first few days to help decrease swelling and bruising, then switch to heat to help relax tight/sore spots and speed recovery. Some people prefer to use ice alone, heat alone, alternating between ice & heat. Experiment to what works for you. Swelling and bruising can take several weeks to resolve.  3. It is helpful to take an over-the-counter pain medication regularly for the first few weeks. Choose one of the following that works best for you:  1. Naproxen (Aleve, etc) Two 220mg  tabs twice a day 2. Ibuprofen (Advil, etc) Three 200mg  tabs four times a day (every meal & bedtime) 3. Acetaminophen (Tylenol, etc) 500-650mg  four times a day (every meal & bedtime) 4. A prescription for pain medication (such as oxycodone, hydrocodone, etc) should be given to you upon discharge. Take your pain medication as prescribed.  1. If you are having problems/concerns with the prescription medicine (does not control pain, nausea, vomiting, rash, itching, etc), please call us [phone] to see if we need to switch you to a different pain medicine that will work better for you and/or control your side effect better. 2. If you need a refill on your pain medication, please contact your pharmacy. They will contact our office to request authorization. Prescriptions will not be filled after 5 pm or on week-ends. 4. Avoid getting constipated. When taking pain medications, it is common to experience some constipation. Increasing fluid intake and taking a fiber supplement (such as Metamucil, Citrucel, FiberCon, MiraLax, etc) 1-2 times a day regularly will usually help prevent this problem from occurring. A mild laxative (prune juice, Milk of Magnesia, MiraLax, etc) should be taken according to package directions if there are no bowel movements after 48 hours.  5. Watch out for diarrhea. If you have many loose bowel movements, simplify your diet to bland foods & liquids for a few days.  Stop any stool softeners and decrease your fiber supplement. Switching to mild anti-diarrheal medications (Kayopectate, Pepto Bismol) can help. If this worsens or does not improve, please call us.  WHEN TO CALL us [phone]:  1. Poor pain control 2. Reactions / problems with new medications (rash/itching, nausea, etc)  3. Fever over 101.5 F (38.5 C) 4. Worsening rib pain, productive cough, chills or any other concerning symptoms  The clinic staff is available to answer your questions during regular business hours (8:30am-5pm). Please dont hesitate to call and ask to speak to one of our nurses for clinical concerns.  If you have a medical emergency, go  to the nearest emergency room or call 911.  A surgeon from [facility] is always on call at the [facility]   [facility] [place]  [address], [address], [place], [place] [address] ?  MAIN: [phone] ? TOLL FREE: [phone] ?  FAX [phone]  [email or web address]

## 2018-05-12 NOTE — Transfer of Care (Signed)
Immediate Anesthesia Transfer of Care Note  Patient: Jeffery Wallace  Procedure(s) Performed: OPEN TREATMENT OF SCAPHOID FRACTURE ABND PINNING OF DISTAS RADIUS (Left Hand)  Patient Location: PACU  Anesthesia Type:General  Level of Consciousness: awake, alert  and oriented  Airway & Oxygen Therapy: Patient Spontanous Breathing and Patient connected to nasal cannula oxygen  Post-op Assessment: Report given to RN and Post -op Vital signs reviewed and stable  Post vital signs: Reviewed and stable  Last Vitals:  Vitals Value Taken Time  BP 116/44 05/12/2018  2:10 AM  Temp    Pulse 97 05/12/2018  2:12 AM  Resp 33 05/12/2018  2:12 AM  SpO2 100 % 05/12/2018  2:12 AM  Vitals shown include unvalidated device data.  Last Pain:  Vitals:   05/12/18 0032  TempSrc:   PainSc: Asleep         Complications: No apparent anesthesia complications

## 2018-05-12 NOTE — Anesthesia Procedure Notes (Signed)
Procedure Name: Intubation Date/Time: 05/12/2018 1:13 AM Performed by: Babs Bertin, CRNA Pre-anesthesia Checklist: Patient identified, Emergency Drugs available, Suction available and Patient being monitored Patient Re-evaluated:Patient Re-evaluated prior to induction Oxygen Delivery Method: Circle System Utilized Preoxygenation: Pre-oxygenation with 100% oxygen Induction Type: IV induction, Rapid sequence and Cricoid Pressure applied Laryngoscope Size: Mac and 3 Grade View: Grade I Tube type: Oral Tube size: 7.5 mm Number of attempts: 1 Airway Equipment and Method: Stylet and Oral airway Placement Confirmation: ETT inserted through vocal cords under direct vision,  positive ETCO2 and breath sounds checked- equal and bilateral Secured at: 22 cm Tube secured with: Tape Dental Injury: Teeth and Oropharynx as per pre-operative assessment

## 2018-05-12 NOTE — ED Notes (Signed)
Transported to [facility] #[id number].

## 2018-05-12 NOTE — Anesthesia Postprocedure Evaluation (Signed)
Anesthesia Post Note  Patient: Jeffery Wallace  Procedure(s) Performed: OPEN TREATMENT OF SCAPHOID FRACTURE ABND PINNING OF DISTAS RADIUS (Left Hand)     Patient location during evaluation: PACU Anesthesia Type: General Level of consciousness: awake and alert Pain management: pain level controlled Vital Signs Assessment: post-procedure vital signs reviewed and stable Respiratory status: spontaneous breathing, nonlabored ventilation and respiratory function stable Cardiovascular status: blood pressure returned to baseline and stable Postop Assessment: no apparent nausea or vomiting Anesthetic complications: no    Last Vitals:  Vitals:   05/12/18 0245 05/12/18 0300  BP: (!) 108/45 121/60  Pulse: 77 72  Resp: 20 17  Temp: 36.6 C 36.8 C  SpO2: 97% 99%    Last Pain:  Vitals:   05/12/18 0245  TempSrc:   PainSc: 0-No pain                 Veto Macqueen,W. EDMOND

## 2018-05-13 ENCOUNTER — Inpatient Hospital Stay (HOSPITAL_COMMUNITY): Payer: Medicaid Other

## 2018-05-13 ENCOUNTER — Encounter (HOSPITAL_COMMUNITY): Payer: Self-pay | Admitting: Orthopedic Surgery

## 2018-05-13 MED ORDER — OXYCODONE HCL 5 MG PO TABS
5.0000 mg | ORAL_TABLET | ORAL | Status: DC | PRN
  Administered 2018-05-14: 10 mg via ORAL
  Filled 2018-05-13: qty 2

## 2018-05-13 MED ORDER — ACETAMINOPHEN 325 MG PO TABS
650.0000 mg | ORAL_TABLET | Freq: Four times a day (QID) | ORAL | Status: DC
  Administered 2018-05-13 – 2018-05-14 (×5): 650 mg via ORAL
  Filled 2018-05-13 (×5): qty 2

## 2018-05-13 MED ORDER — MENTHOL 3 MG MT LOZG
1.0000 | LOZENGE | OROMUCOSAL | Status: DC | PRN
  Administered 2018-05-13: 3 mg via ORAL
  Filled 2018-05-13: qty 9

## 2018-05-13 MED ORDER — DOCUSATE SODIUM 100 MG PO CAPS
100.0000 mg | ORAL_CAPSULE | Freq: Every day | ORAL | Status: DC
  Administered 2018-05-13 – 2018-05-14 (×2): 100 mg via ORAL
  Filled 2018-05-13 (×2): qty 1

## 2018-05-13 NOTE — Progress Notes (Signed)
Pt c/o sore throat. No swelling or redness on assessment. Paged Trauma MD on call and he gave verbal order for lozenges. Will continue to monitor pt.
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
## 2018-05-13 NOTE — Plan of Care (Signed)
  Problem: Education: Goal: Knowledge of General Education information will improve Description Including pain rating scale, medication(s)/side effects and non-pharmacologic comfort measures Outcome: Progressing   Problem: Clinical Measurements: Goal: Will remain free from infection Outcome: Progressing Goal: Diagnostic test results will improve Outcome: Progressing Goal: Respiratory complications will improve Outcome: Progressing Goal: Cardiovascular complication will be avoided Outcome: Progressing   Problem: Activity: Goal: Risk for activity intolerance will decrease Outcome: Progressing   Problem: Coping: Goal: Level of anxiety will decrease Outcome: Progressing   Problem: Pain Managment: Goal: General experience of comfort will improve Outcome: Progressing

## 2018-05-13 NOTE — Plan of Care (Signed)
  Problem: Pain Managment: Goal: General experience of comfort will improve Outcome: Progressing   Problem: Safety: Goal: Ability to remain free from injury will improve Outcome: Progressing   
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
## 2018-05-13 NOTE — Plan of Care (Signed)
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
## 2018-05-14 ENCOUNTER — Encounter (HOSPITAL_COMMUNITY): Payer: Self-pay

## 2018-05-14 ENCOUNTER — Other Ambulatory Visit: Payer: Self-pay

## 2018-05-14 MED ORDER — METHOCARBAMOL 500 MG PO TABS: 500.0000 mg | ORAL_TABLET | Freq: Three times a day (TID) | ORAL | 0 refills | Status: AC | PRN
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
## 2018-05-14 NOTE — Progress Notes (Signed)
Crutches at bedside. Provided discharge education/instructions, all questions and concerns addressed, Pt not in distress, to discharge home with belongings accompanied by family.

## 2018-08-27 ENCOUNTER — Other Ambulatory Visit: Payer: Self-pay

## 2018-08-27 ENCOUNTER — Emergency Department (HOSPITAL_COMMUNITY)
Admission: EM | Admit: 2018-08-27 | Discharge: 2018-08-27 | Disposition: A | Discharge status: Home / Self Care | Payer: Medicaid Other | Attending: Emergency Medicine | Admitting: Emergency Medicine

## 2018-08-27 ENCOUNTER — Encounter (HOSPITAL_COMMUNITY): Payer: Self-pay | Admitting: Emergency Medicine

## 2018-08-27 ENCOUNTER — Emergency Department (HOSPITAL_COMMUNITY): Payer: Medicaid Other

## 2018-08-27 DIAGNOSIS — Y999 Unspecified external cause status: Secondary | ICD-10-CM | POA: Insufficient documentation

## 2018-08-27 DIAGNOSIS — T148XXA Other injury of unspecified body region, initial encounter: Secondary | ICD-10-CM

## 2018-08-27 DIAGNOSIS — Z23 Encounter for immunization: Secondary | ICD-10-CM | POA: Diagnosis not present

## 2018-08-27 DIAGNOSIS — S80212A Abrasion, left knee, initial encounter: Secondary | ICD-10-CM | POA: Diagnosis not present

## 2018-08-27 DIAGNOSIS — Y92414 Local residential or business street as the place of occurrence of the external cause: Secondary | ICD-10-CM | POA: Diagnosis not present

## 2018-08-27 DIAGNOSIS — S50312A Abrasion of left elbow, initial encounter: Secondary | ICD-10-CM | POA: Diagnosis not present

## 2018-08-27 DIAGNOSIS — S93402A Sprain of unspecified ligament of left ankle, initial encounter: Secondary | ICD-10-CM | POA: Insufficient documentation

## 2018-08-27 DIAGNOSIS — S99912A Unspecified injury of left ankle, initial encounter: Secondary | ICD-10-CM | POA: Diagnosis present

## 2018-08-27 DIAGNOSIS — Y9389 Activity, other specified: Secondary | ICD-10-CM | POA: Diagnosis not present

## 2018-08-27 MED ORDER — HYDROCODONE-ACETAMINOPHEN 5-325 MG PO TABS
1.0000 | ORAL_TABLET | Freq: Once | ORAL | Status: AC
  Administered 2018-08-27: 1 via ORAL
  Filled 2018-08-27: qty 1

## 2018-08-27 MED ORDER — ONDANSETRON 4 MG PO TBDP
4.0000 mg | ORAL_TABLET | Freq: Once | ORAL | Status: DC
  Filled 2018-08-27: qty 1

## 2018-08-27 MED ORDER — TETANUS-DIPHTH-ACELL PERTUSSIS 5-2.5-18.5 LF-MCG/0.5 IM SUSP
0.5000 mL | Freq: Once | INTRAMUSCULAR | Status: AC
  Administered 2018-08-27: 0.5 mL via INTRAMUSCULAR
  Filled 2018-08-27: qty 0.5
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
## 2018-08-27 NOTE — ED Notes (Signed)
Patient transported to X-ray 

## 2018-08-27 NOTE — Discharge Instructions (Signed)
Your wrist XR did not show any new abnormalities. Your previous injury is slightly worse. Please follow up with your ortho doctor or the referred doctor.   Make sure you are keeping your wounds clean and dry. Gently clean them with soap and water.   Follow up with your Primary Care Doctor as needed.   You can take Tylenol or Ibuprofen as directed for pain. You can alternate Tylenol and Ibuprofen every 4 hours. If you take Tylenol at 1pm, then you can take Ibuprofen at 5pm. Then you can take Tylenol again at 9pm. Do not exceed 4000 mg of tylenol a day. Do not exceed 800 mg of ibuprofen a day.     Return to the Emergency Department immediately for any worsening pain, redness/swelling of the ankle, gray or blue color to the toes, numbness/weakness of toes or foot, difficulty walking or any other worsening or concerning symptoms.    Ankle sprain Ankle sprain occurs when the ligaments that hold the ankle joint to get her are stretched or torn. It may take 4-6 weeks to heal.  For activity: Use crutches with nonweightbearing for the first few days. Then, you may walk on your ankles as the pain allows, or as instructed. Start gradually with weight bearing on the affected ankle. Once you can walk pain free, then try jogging. When you can run forwards, then you can try moving side to side. If you cannot walk without crutches in one week, you need a recheck by your Family Doctor.  If you do not have a family doctor to followup with, you can see the list of phone numbers below. Please call today to make a followup appointment.   RICE therapy:  Routine Care for injuries  Rest, Ice, Compression, Elevation (RICE)  Rest is needed to allow your body to heal. Routine activities can be resumed when comfortable. Injury tendons and bones can take up to 6 weeks to heal. Tendons are cordlike structures that attach muscles and bones.  Ice following an injury helps keep the swelling down and reduce the pain. Put  ice in a plastic bag. Place a towel between your skin and the bag of ice. Leave the ice on for 15-20 minutes, 3-4 times a day. Do this while awake, for the first 24-48 hours. After that continue as directed by your caregiver.  Compression helps keep swelling down. It also gives support and helps with discomfort. If any lasting bandage has been applied, it should be removed and reapplied every 3-4 hours. It should not be applied tightly, but firmly enough to keep swelling down. Watch fingers or toes for swelling, discoloration, coldness, numbness or excessive pain. If any of these problems occur, removed the bandage and reapply loosely. Contact your caregiver if these problems continue.  Elevation helps reduce swelling and decrease your pain. With extremities such as the arms, hands, legs and feet, the injured area should be placed near or above the level of the heart if possible.
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
## 2018-08-27 NOTE — ED Notes (Signed)
Ortho tech at bedside 

## 2018-08-27 NOTE — ED Notes (Signed)
Pt tolerating fluids.   

## 2019-10-25 ENCOUNTER — Encounter (HOSPITAL_COMMUNITY): Payer: Self-pay

## 2019-10-25 ENCOUNTER — Observation Stay (HOSPITAL_COMMUNITY)
Admission: EM | Admit: 2019-10-25 | Discharge: 2019-10-26 | Disposition: A | Discharge status: Home / Self Care | Payer: Medicaid Other | Attending: General Surgery | Admitting: General Surgery

## 2019-10-25 ENCOUNTER — Emergency Department (HOSPITAL_COMMUNITY): Payer: Medicaid Other

## 2019-10-25 DIAGNOSIS — F1010 Alcohol abuse, uncomplicated: Secondary | ICD-10-CM | POA: Diagnosis not present

## 2019-10-25 DIAGNOSIS — Y93I9 Activity, other involving external motion: Secondary | ICD-10-CM | POA: Insufficient documentation

## 2019-10-25 DIAGNOSIS — S21112A Laceration without foreign body of left front wall of thorax without penetration into thoracic cavity, initial encounter: Secondary | ICD-10-CM | POA: Diagnosis not present

## 2019-10-25 DIAGNOSIS — S270XXA Traumatic pneumothorax, initial encounter: Secondary | ICD-10-CM

## 2019-10-25 DIAGNOSIS — Z20822 Contact with and (suspected) exposure to covid-19: Secondary | ICD-10-CM | POA: Insufficient documentation

## 2019-10-25 DIAGNOSIS — Y92410 Unspecified street and highway as the place of occurrence of the external cause: Secondary | ICD-10-CM | POA: Insufficient documentation

## 2019-10-25 DIAGNOSIS — S51811A Laceration without foreign body of right forearm, initial encounter: Secondary | ICD-10-CM

## 2019-10-25 DIAGNOSIS — S272XXA Traumatic hemopneumothorax, initial encounter: Secondary | ICD-10-CM | POA: Diagnosis not present

## 2019-10-25 DIAGNOSIS — S2191XA Laceration without foreign body of unspecified part of thorax, initial encounter: Secondary | ICD-10-CM

## 2019-10-25 DIAGNOSIS — J942 Hemothorax: Secondary | ICD-10-CM

## 2019-10-25 HISTORY — DX: Alcohol abuse, uncomplicated: F10.10

## 2019-10-25 HISTORY — DX: Social phobia, unspecified: F40.10

## 2019-10-25 LAB — COMPREHENSIVE METABOLIC PANEL
ALT: 29 U/L (ref 0–44)
AST: 37 U/L (ref 15–41)
Albumin: 4.5 g/dL (ref 3.5–5.0)
Alkaline Phosphatase: 96 U/L (ref 38–126)
Anion gap: 20 — ABNORMAL HIGH (ref 5–15)
BUN: 6 mg/dL (ref 6–20)
CO2: 18 mmol/L — ABNORMAL LOW (ref 22–32)
Calcium: 9.5 mg/dL (ref 8.9–10.3)
Chloride: 102 mmol/L (ref 98–111)
Creatinine, Ser: 1.31 mg/dL — ABNORMAL HIGH (ref 0.61–1.24)
GFR calc Af Amer: >60 mL/min (ref >60)
GFR calc non Af Amer: >60 mL/min (ref >60)
Glucose, Bld: 128 mg/dL — ABNORMAL HIGH (ref 70–99)
Potassium: 3.1 mmol/L — ABNORMAL LOW (ref 3.5–5.1)
Sodium: 140 mmol/L (ref 135–145)
Total Bilirubin: 0.8 mg/dL (ref 0.3–1.2)
Total Protein: 6.9 g/dL (ref 6.5–8.1)

## 2019-10-25 LAB — ETHANOL: Alcohol, Ethyl (B): 142 mg/dL — ABNORMAL HIGH (ref <10)

## 2019-10-25 LAB — I-STAT CHEM 8, ED
BUN: 5 mg/dL — ABNORMAL LOW (ref 6–20)
Calcium, Ion: 1.1 mmol/L — ABNORMAL LOW (ref 1.15–1.40)
Chloride: 102 mmol/L (ref 98–111)
Creatinine, Ser: 1.5 mg/dL — ABNORMAL HIGH (ref 0.61–1.24)
Glucose, Bld: 122 mg/dL — ABNORMAL HIGH (ref 70–99)
HCT: 45 % (ref 39.0–52.0)
Hemoglobin: 15.3 g/dL (ref 13.0–17.0)
Potassium: 3 mmol/L — ABNORMAL LOW (ref 3.5–5.1)
Sodium: 141 mmol/L (ref 135–145)
TCO2: 22 mmol/L (ref 22–32)

## 2019-10-25 LAB — CBC
HCT: 45.4 % (ref 39.0–52.0)
Hemoglobin: 15.5 g/dL (ref 13.0–17.0)
MCH: 31.6 pg (ref 26.0–34.0)
MCHC: 34.1 g/dL (ref 30.0–36.0)
MCV: 92.5 fL (ref 80.0–100.0)
Platelets: 292 10*3/uL (ref 150–400)
RBC: 4.91 MIL/uL (ref 4.22–5.81)
RDW: 13.4 % (ref 11.5–15.5)
WBC: 7.7 10*3/uL (ref 4.0–10.5)
nRBC: 0 % (ref 0.0–0.2)

## 2019-10-25 LAB — SARS CORONAVIRUS 2 BY RT PCR (HOSPITAL ORDER, PERFORMED IN ~~LOC~~ HOSPITAL LAB): SARS Coronavirus 2: NEGATIVE

## 2019-10-25 LAB — PHOSPHORUS: Phosphorus: 2.4 mg/dL — ABNORMAL LOW (ref 2.5–4.6)

## 2019-10-25 LAB — PROTIME-INR
INR: 1.1 (ref 0.8–1.2)
Prothrombin Time: 13.6 seconds (ref 11.4–15.2)

## 2019-10-25 LAB — SAMPLE TO BLOOD BANK

## 2019-10-25 MED ORDER — LIDOCAINE-EPINEPHRINE (PF) 2 %-1:200000 IJ SOLN
30.0000 mL | Freq: Once | INTRAMUSCULAR | Status: AC
  Administered 2019-10-25: 30 mL
  Filled 2019-10-25: qty 40

## 2019-10-25 MED ORDER — LORAZEPAM 1 MG PO TABS: 0.0000 mg | ORAL_TABLET | Freq: Two times a day (BID) | ORAL | Status: DC

## 2019-10-25 MED ORDER — IOHEXOL 300 MG/ML  SOLN
75.0000 mL | Freq: Once | INTRAMUSCULAR | Status: AC | PRN
  Administered 2019-10-25: 75 mL via INTRAVENOUS

## 2019-10-25 MED ORDER — ENOXAPARIN SODIUM 40 MG/0.4ML ~~LOC~~ SOLN
40.0000 mg | SUBCUTANEOUS | Status: DC
  Administered 2019-10-26: 40 mg via SUBCUTANEOUS
  Filled 2019-10-25: qty 0.4

## 2019-10-25 MED ORDER — LORAZEPAM 1 MG PO TABS: 1.0000 mg | ORAL_TABLET | ORAL | Status: DC | PRN

## 2019-10-25 MED ORDER — THIAMINE HCL 100 MG/ML IJ SOLN: 100.0000 mg | Freq: Every day | INTRAMUSCULAR | Status: DC

## 2019-10-25 MED ORDER — TETANUS-DIPHTH-ACELL PERTUSSIS 5-2.5-18.5 LF-MCG/0.5 IM SUSP
0.5000 mL | Freq: Once | INTRAMUSCULAR | Status: AC
  Administered 2019-10-25: 0.5 mL via INTRAMUSCULAR

## 2019-10-25 MED ORDER — FOLIC ACID 1 MG PO TABS
1.0000 mg | ORAL_TABLET | Freq: Every day | ORAL | Status: DC
  Administered 2019-10-26: 1 mg via ORAL
  Filled 2019-10-25: qty 1

## 2019-10-25 MED ORDER — LORAZEPAM 2 MG/ML IJ SOLN
1.0000 mg | INTRAMUSCULAR | Status: DC | PRN
  Administered 2019-10-26: 1 mg via INTRAVENOUS
  Filled 2019-10-25: qty 1

## 2019-10-25 MED ORDER — ONDANSETRON HCL 4 MG/2ML IJ SOLN
4.0000 mg | Freq: Four times a day (QID) | INTRAMUSCULAR | Status: DC | PRN
  Administered 2019-10-26 (×2): 4 mg via INTRAVENOUS
  Filled 2019-10-25 (×2): qty 2

## 2019-10-25 MED ORDER — ACETAMINOPHEN 325 MG PO TABS: 650.0000 mg | ORAL_TABLET | ORAL | Status: DC | PRN

## 2019-10-25 MED ORDER — THIAMINE HCL 100 MG PO TABS
100.0000 mg | ORAL_TABLET | Freq: Every day | ORAL | Status: DC
  Administered 2019-10-26: 100 mg via ORAL
  Filled 2019-10-25: qty 1

## 2019-10-25 MED ORDER — CEFAZOLIN SODIUM-DEXTROSE 2-4 GM/100ML-% IV SOLN
2.0000 g | Freq: Three times a day (TID) | INTRAVENOUS | Status: DC
  Administered 2019-10-26: 2 g via INTRAVENOUS
  Filled 2019-10-25 (×3): qty 100

## 2019-10-25 MED ORDER — OXYCODONE HCL 5 MG PO TABS: 5.0000 mg | ORAL_TABLET | ORAL | Status: DC | PRN

## 2019-10-25 MED ORDER — LORAZEPAM 2 MG/ML IJ SOLN
2.0000 mg | Freq: Once | INTRAMUSCULAR | Status: AC
  Administered 2019-10-25: 2 mg via INTRAVENOUS

## 2019-10-25 MED ORDER — CEFAZOLIN SODIUM-DEXTROSE 2-4 GM/100ML-% IV SOLN
2.0000 g | Freq: Once | INTRAVENOUS | Status: AC
  Administered 2019-10-25: 2 g via INTRAVENOUS

## 2019-10-25 MED ORDER — MORPHINE SULFATE (PF) 4 MG/ML IV SOLN
4.0000 mg | INTRAVENOUS | Status: DC | PRN
  Administered 2019-10-26: 4 mg via INTRAVENOUS
  Filled 2019-10-25: qty 1

## 2019-10-25 MED ORDER — LORAZEPAM 2 MG/ML IJ SOLN
INTRAMUSCULAR | Status: AC
  Filled 2019-10-25: qty 1

## 2019-10-25 MED ORDER — LORAZEPAM 1 MG PO TABS: 0.0000 mg | ORAL_TABLET | Freq: Four times a day (QID) | ORAL | Status: DC

## 2019-10-25 MED ORDER — SODIUM CHLORIDE 0.9 % IV SOLN: INTRAVENOUS | Status: DC

## 2019-10-25 MED ORDER — ADULT MULTIVITAMIN W/MINERALS CH
1.0000 | ORAL_TABLET | Freq: Every day | ORAL | Status: DC
  Administered 2019-10-26: 1 via ORAL
  Filled 2019-10-25: qty 1

## 2019-10-25 MED ORDER — ONDANSETRON 4 MG PO TBDP: 4.0000 mg | ORAL_TABLET | Freq: Four times a day (QID) | ORAL | Status: DC | PRN

## 2019-10-25 MED ORDER — OXYCODONE HCL 5 MG PO TABS
10.0000 mg | ORAL_TABLET | ORAL | Status: DC | PRN
  Administered 2019-10-26 (×3): 10 mg via ORAL
  Filled 2019-10-25 (×3): qty 2
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
## 2019-10-25 NOTE — ED Notes (Signed)
Dr. Michel Harrow in suturing right forearm
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
## 2019-10-25 NOTE — ED Notes (Signed)
Pt placed on 02 via [place] at 4LPM then to CT  Pt remains alert and oriented x's 4.  Bleeding controlled at this time

## 2019-10-25 NOTE — ED Notes (Signed)
Pt's brother in to talk with pt.

## 2019-10-25 NOTE — ED Notes (Signed)
P:t returned from CT.  [place] [employer] in to talk with pt

## 2019-10-25 NOTE — ED Notes (Signed)
Dressings applied to wounds.  Pt remains alert and oriented x's 3.  Pt requesting something to drink and pain meds.

## 2019-10-25 NOTE — ED Notes (Signed)
Pt remains alert and oriented x's 3.  Skin warm and dry color appropriate.  Bleeding controlled

## 2019-10-25 NOTE — ED Notes (Signed)
Brother "Jeffery Wallace" would like an update when possible [phone]

## 2019-10-26 ENCOUNTER — Encounter (HOSPITAL_COMMUNITY): Payer: Self-pay | Admitting: Emergency Medicine

## 2019-10-26 ENCOUNTER — Encounter (HOSPITAL_COMMUNITY): Payer: Self-pay

## 2019-10-26 ENCOUNTER — Inpatient Hospital Stay (HOSPITAL_COMMUNITY): Payer: Medicaid Other

## 2019-10-26 LAB — URINALYSIS, ROUTINE W REFLEX MICROSCOPIC
Bilirubin Urine: NEGATIVE
Glucose, UA: NEGATIVE mg/dL
Hgb urine dipstick: NEGATIVE
Ketones, ur: NEGATIVE mg/dL
Leukocytes,Ua: NEGATIVE
Nitrite: NEGATIVE
Protein, ur: NEGATIVE mg/dL
Specific Gravity, Urine: >1.046 — ABNORMAL HIGH (ref 1.005–1.030)
pH: 5 (ref 5.0–8.0)

## 2019-10-26 LAB — CBC
HCT: 37.6 % — ABNORMAL LOW (ref 39.0–52.0)
Hemoglobin: 13 g/dL (ref 13.0–17.0)
MCH: 32.4 pg (ref 26.0–34.0)
MCHC: 34.6 g/dL (ref 30.0–36.0)
MCV: 93.8 fL (ref 80.0–100.0)
Platelets: 246 10*3/uL (ref 150–400)
RBC: 4.01 MIL/uL — ABNORMAL LOW (ref 4.22–5.81)
RDW: 13.5 % (ref 11.5–15.5)
WBC: 16.3 10*3/uL — ABNORMAL HIGH (ref 4.0–10.5)
nRBC: 0 % (ref 0.0–0.2)

## 2019-10-26 LAB — HIV ANTIBODY (ROUTINE TESTING W REFLEX): HIV Screen 4th Generation wRfx: NONREACTIVE

## 2019-10-26 MED ORDER — ACETAMINOPHEN 325 MG PO TABS: 650.0000 mg | ORAL_TABLET | Freq: Four times a day (QID) | ORAL | Status: AC | PRN

## 2019-10-26 MED ORDER — OXYCODONE HCL 10 MG PO TABS: 10.0000 mg | ORAL_TABLET | Freq: Four times a day (QID) | ORAL | 0 refills | Status: AC | PRN

## 2019-10-26 MED ORDER — IBUPROFEN 400 MG PO TABS: 400.0000 mg | ORAL_TABLET | Freq: Four times a day (QID) | ORAL | Status: DC | PRN

## 2019-10-26 MED ORDER — IBUPROFEN 400 MG PO TABS: 400.0000 mg | ORAL_TABLET | Freq: Four times a day (QID) | ORAL | 0 refills | Status: AC | PRN

## 2019-10-26 NOTE — TOC CAGE-AID Note (Signed)
Transition of Care [facility]) - CAGE-AID Screening   Patient Details  Name: Jeffery Wallace MRN: [id number] Date of Birth: [birth year]  Transition of Care [facility]) CM/SW Contact:    Jimmy Picket, [place] Phone Number: 10/26/2019, 11:19 AM   Clinical Narrative:  Pt was sleep and unable to wake for assessment. CSW will re-attempt before discharge.   CAGE-AID Screening: Substance Abuse Screening unable to be completed due to: : Patient unable to participate          Isabella Stalling Clinical Social Worker [phone]

## 2019-10-26 NOTE — Discharge Instructions (Signed)
Take a shower or clean your wounds with mild soap and warm water every day. Pat dry. You may cover the wounds with gauze and tape if desired. Some clear yellow or orange drainage from the wounds is expected but thick, purulent drainage is abnormal. Please call our office or go to the emergency room/urgent care if you get a fever, have redness around your incisions that is spreading, or have new, thick drainage from your wounds.
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
## 2019-10-26 NOTE — Plan of Care (Signed)
°  Problem: Education: °Goal: Knowledge of General Education information will improve °Description: Including pain rating scale, medication(s)/side effects and non-pharmacologic comfort measures °Outcome: Progressing °  °Problem: Health Behavior/Discharge Planning: °Goal: Ability to manage health-related needs will improve °Outcome: Progressing °  °Problem: Clinical Measurements: °Goal: Ability to maintain clinical measurements within normal limits will improve °Outcome: Progressing °Goal: Respiratory complications will improve °Outcome: Progressing °  °Problem: Activity: °Goal: Risk for activity intolerance will decrease °Outcome: Progressing °  °Problem: Pain Managment: °Goal: General experience of comfort will improve °Outcome: Progressing °  °Problem: Safety: °Goal: Ability to remain free from injury will improve °Outcome: Progressing °  °Problem: Skin Integrity: °Goal: Risk for impaired skin integrity will decrease °Outcome: Progressing °  °
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
## 2019-10-26 NOTE — Progress Notes (Signed)
AVS given and reviewed with pt. Medications discussed and signed printed prescription provided. All questions answered to satisfaction. Pt verbalized understanding of information given. Pt escorted off the unit with all belongings via wheelchair by volunteer services.

## 2020-09-22 ENCOUNTER — Encounter (HOSPITAL_COMMUNITY): Payer: Self-pay | Admitting: Emergency Medicine

## 2020-09-22 ENCOUNTER — Other Ambulatory Visit: Payer: Self-pay

## 2020-09-22 ENCOUNTER — Emergency Department (HOSPITAL_COMMUNITY)
Admission: EM | Admit: 2020-09-22 | Discharge: 2020-09-24 | Disposition: A | Discharge status: Home / Self Care | Payer: Medicaid Other | Attending: Emergency Medicine | Admitting: Emergency Medicine

## 2020-09-22 DIAGNOSIS — R456 Violent behavior: Secondary | ICD-10-CM | POA: Insufficient documentation

## 2020-09-22 DIAGNOSIS — F332 Major depressive disorder, recurrent severe without psychotic features: Secondary | ICD-10-CM | POA: Diagnosis not present

## 2020-09-22 DIAGNOSIS — S61412A Laceration without foreign body of left hand, initial encounter: Secondary | ICD-10-CM | POA: Insufficient documentation

## 2020-09-22 DIAGNOSIS — R45851 Suicidal ideations: Secondary | ICD-10-CM | POA: Diagnosis not present

## 2020-09-22 DIAGNOSIS — F4010 Social phobia, unspecified: Secondary | ICD-10-CM | POA: Insufficient documentation

## 2020-09-22 DIAGNOSIS — Y908 Blood alcohol level of 240 mg/100 ml or more: Secondary | ICD-10-CM | POA: Diagnosis not present

## 2020-09-22 DIAGNOSIS — W25XXXA Contact with sharp glass, initial encounter: Secondary | ICD-10-CM | POA: Diagnosis not present

## 2020-09-22 DIAGNOSIS — Z046 Encounter for general psychiatric examination, requested by authority: Secondary | ICD-10-CM | POA: Diagnosis not present

## 2020-09-22 DIAGNOSIS — F1020 Alcohol dependence, uncomplicated: Secondary | ICD-10-CM | POA: Diagnosis not present

## 2020-09-22 DIAGNOSIS — Z20822 Contact with and (suspected) exposure to covid-19: Secondary | ICD-10-CM | POA: Diagnosis not present

## 2020-09-22 DIAGNOSIS — S61411A Laceration without foreign body of right hand, initial encounter: Secondary | ICD-10-CM | POA: Diagnosis not present

## 2020-09-22 DIAGNOSIS — T07XXXA Unspecified multiple injuries, initial encounter: Secondary | ICD-10-CM

## 2020-09-22 DIAGNOSIS — R4689 Other symptoms and signs involving appearance and behavior: Secondary | ICD-10-CM

## 2020-09-22 DIAGNOSIS — F1010 Alcohol abuse, uncomplicated: Secondary | ICD-10-CM

## 2020-09-22 DIAGNOSIS — S6991XA Unspecified injury of right wrist, hand and finger(s), initial encounter: Secondary | ICD-10-CM | POA: Diagnosis present
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
## 2020-09-23 ENCOUNTER — Other Ambulatory Visit: Payer: Self-pay

## 2020-09-23 ENCOUNTER — Emergency Department (HOSPITAL_COMMUNITY): Payer: Medicaid Other

## 2020-09-23 LAB — CBC WITH DIFFERENTIAL/PLATELET
Abs Immature Granulocytes: 0.04 10*3/uL (ref 0.00–0.07)
Basophils Absolute: 0.1 10*3/uL (ref 0.0–0.1)
Basophils Relative: 1 %
Eosinophils Absolute: 0.4 10*3/uL (ref 0.0–0.5)
Eosinophils Relative: 5 %
HCT: 50.2 % (ref 39.0–52.0)
Hemoglobin: 17.4 g/dL — ABNORMAL HIGH (ref 13.0–17.0)
Immature Granulocytes: 0 %
Lymphocytes Relative: 52 %
Lymphs Abs: 5.1 10*3/uL — ABNORMAL HIGH (ref 0.7–4.0)
MCH: 30.4 pg (ref 26.0–34.0)
MCHC: 34.7 g/dL (ref 30.0–36.0)
MCV: 87.8 fL (ref 80.0–100.0)
Monocytes Absolute: 0.7 10*3/uL (ref 0.1–1.0)
Monocytes Relative: 7 %
Neutro Abs: 3.4 10*3/uL (ref 1.7–7.7)
Neutrophils Relative %: 35 %
Platelets: 294 10*3/uL (ref 150–400)
RBC: 5.72 MIL/uL (ref 4.22–5.81)
RDW: 12.8 % (ref 11.5–15.5)
WBC: 9.8 10*3/uL (ref 4.0–10.5)
nRBC: 0 % (ref 0.0–0.2)

## 2020-09-23 LAB — RAPID URINE DRUG SCREEN, HOSP PERFORMED
Amphetamines: NOT DETECTED
Barbiturates: NOT DETECTED
Benzodiazepines: POSITIVE — AB
Cocaine: NOT DETECTED
Opiates: NOT DETECTED
Tetrahydrocannabinol: POSITIVE — AB

## 2020-09-23 LAB — COMPREHENSIVE METABOLIC PANEL
ALT: 50 U/L — ABNORMAL HIGH (ref 0–44)
AST: 40 U/L (ref 15–41)
Albumin: 5 g/dL (ref 3.5–5.0)
Alkaline Phosphatase: 97 U/L (ref 38–126)
Anion gap: 15 (ref 5–15)
BUN: 6 mg/dL (ref 6–20)
CO2: 24 mmol/L (ref 22–32)
Calcium: 9.3 mg/dL (ref 8.9–10.3)
Chloride: 102 mmol/L (ref 98–111)
Creatinine, Ser: 1.16 mg/dL (ref 0.61–1.24)
GFR, Estimated: >60 mL/min (ref >60)
Glucose, Bld: 108 mg/dL — ABNORMAL HIGH (ref 70–99)
Potassium: 4.2 mmol/L (ref 3.5–5.1)
Sodium: 141 mmol/L (ref 135–145)
Total Bilirubin: 0.7 mg/dL (ref 0.3–1.2)
Total Protein: 8.6 g/dL — ABNORMAL HIGH (ref 6.5–8.1)

## 2020-09-23 LAB — ETHANOL: Alcohol, Ethyl (B): 277 mg/dL — ABNORMAL HIGH (ref <10)

## 2020-09-23 LAB — RESP PANEL BY RT-PCR (FLU A&B, COVID) ARPGX2
Influenza A by PCR: NEGATIVE
Influenza B by PCR: NEGATIVE
SARS Coronavirus 2 by RT PCR: NEGATIVE

## 2020-09-23 MED ORDER — OLANZAPINE 5 MG PO TABS
5.0000 mg | ORAL_TABLET | Freq: Every day | ORAL | Status: DC
  Administered 2020-09-23: 5 mg via ORAL
  Filled 2020-09-23: qty 1

## 2020-09-23 MED ORDER — LORAZEPAM 1 MG PO TABS
2.0000 mg | ORAL_TABLET | Freq: Once | ORAL | Status: AC
  Administered 2020-09-23: 2 mg via ORAL
  Filled 2020-09-23: qty 2

## 2020-09-23 MED ORDER — ZIPRASIDONE MESYLATE 20 MG IM SOLR
20.0000 mg | Freq: Once | INTRAMUSCULAR | Status: AC
  Administered 2020-09-23: 20 mg via INTRAMUSCULAR
  Filled 2020-09-23: qty 20

## 2020-09-23 MED ORDER — LORAZEPAM 2 MG/ML IJ SOLN: 2.0000 mg | Freq: Once | INTRAMUSCULAR | Status: DC

## 2020-09-23 MED ORDER — LORAZEPAM 2 MG/ML IJ SOLN: 2.0000 mg | Freq: Once | INTRAMUSCULAR | Status: AC

## 2020-09-23 MED ORDER — CEPHALEXIN 250 MG PO CAPS
250.0000 mg | ORAL_CAPSULE | Freq: Three times a day (TID) | ORAL | Status: DC
  Administered 2020-09-23 – 2020-09-24 (×4): 250 mg via ORAL
  Filled 2020-09-23 (×4): qty 1

## 2020-09-23 MED ORDER — LORAZEPAM 2 MG/ML IJ SOLN
INTRAMUSCULAR | Status: AC
  Administered 2020-09-23: 2 mg via INTRAMUSCULAR
  Filled 2020-09-23: qty 1

## 2020-09-23 MED ORDER — LIDOCAINE HCL 2 % IJ SOLN
INTRAMUSCULAR | Status: AC
  Filled 2020-09-23: qty 20

## 2020-09-23 MED ORDER — GABAPENTIN 300 MG PO CAPS
300.0000 mg | ORAL_CAPSULE | Freq: Three times a day (TID) | ORAL | Status: DC
  Administered 2020-09-23 – 2020-09-24 (×3): 300 mg via ORAL
  Filled 2020-09-23 (×3): qty 1

## 2020-09-23 MED ORDER — STERILE WATER FOR INJECTION IJ SOLN
INTRAMUSCULAR | Status: AC
  Administered 2020-09-23: 10 mL
  Filled 2020-09-23: qty 10

## 2020-09-23 MED ORDER — HYDROXYZINE HCL 25 MG PO TABS: 25.0000 mg | ORAL_TABLET | Freq: Three times a day (TID) | ORAL | Status: DC | PRN

## 2020-09-23 NOTE — BHH Counselor (Addendum)
Per Ophelia Shoulder, PMHNP patient meets in patient care criteria. Patient's family notified of disposition. [facility] ED notified of disposition via secure chat. Patient referred to [facility] for review.  Per Rayfield Citizen, RN/AC at [facility] at capacity. CSW to seek placement.

## 2020-09-23 NOTE — BH Assessment (Addendum)
Pt was given 20mg  Geodon at 01:17 and 2mg  Ativan at 00:17.  Per ED secretary patient is too sleepy to be able to complete teleassessment now.

## 2020-09-23 NOTE — ED Provider Notes (Signed)
[facility]-EMERGENCY DEPT Provider Note  CSN: [id number] Arrival date & time: 09/22/20 2346  Chief Complaint(s) Psychiatric Evaluation  HPI Jeffery Wallace is a 25 y.o. male brought in by [facility] after being IVC by his brother for alcoholism, suicidal ideations and hostility.  IVC reports that the patient's states that he no longer wants to live.  He is not cooperative with [employer] or [place], limiting history.  Patient apparently punched through glass resulting in numerous lacerations to the right hand.  In her records it appears tetanus is updated.  Remainder of history, ROS, and physical exam limited due to patient's condition (psych). Additional information was obtained from [employer].   Level V Caveat.   HPI  Past Medical History Past Medical History:  Diagnosis Date  . ETOH abuse    reports daily drinking  . Social anxiety disorder    Patient Active Problem List   Diagnosis Date Noted  . Hemopneumothorax on left 10/25/2019  . Rib fracture 05/11/2018   Home Medication(s) Prior to Admission medications   Medication Sig Start Date End Date Taking? Authorizing Provider  acetaminophen (TYLENOL) 325 MG tablet Take 2 tablets (650 mg total) by mouth every 6 (six) hours as needed for mild pain. 10/26/19   Adam Phenix, PA-C  acetaminophen (TYLENOL) 500 MG tablet Take 2 tablets (1,000 mg total) by mouth every 6 (six) hours as needed. 05/12/18   Barnetta Chapel, PA-C  amoxicillin (AMOXIL) 500 MG capsule Take 2 capsules (1,000 mg total) by mouth 2 (two) times daily. 01/11/16   Hayden Rasmussen, NP  ibuprofen (ADVIL) 400 MG tablet Take 1-2 tablets (400-800 mg total) by mouth every 6 (six) hours as needed for mild pain or moderate pain. 10/26/19   Adam Phenix, PA-C  methocarbamol (ROBAXIN) 500 MG tablet Take 1 tablet (500 mg total) by mouth every 8 (eight) hours as needed for muscle spasms. 05/14/18   Focht, Joyce Copa, PA  oxyCODONE (ROXICODONE) 5 MG immediate release tablet  Take 1 tablet (5 mg total) by mouth every 4 (four) hours as needed for severe pain (postop pain). 05/12/18   Barnetta Chapel, PA-C  oxyCODONE 10 MG TABS Take 1 tablet (10 mg total) by mouth every 6 (six) hours as needed for severe pain (not relieved by tylenol or ibuprofen). 10/26/19   Adam Phenix, PA-C                                                                                                                                    Past Surgical History Past Surgical History:  Procedure Laterality Date  . ORIF RADIAL FRACTURE Left 05/12/2018   Procedure: OPEN TREATMENT OF SCAPHOID FRACTURE ABND PINNING OF DISTAS RADIUS;  Surgeon: Mack Hook, MD;  Location: [facility] OR;  Service: Orthopedics;  Laterality: Left;   Family History Family History  Problem Relation Age of Onset  . Heart murmur Mother   . Scoliosis Mother  Social History Social History   Tobacco Use  . Smoking status: Never Smoker  . Smokeless tobacco: Never Used  Vaping Use  . Vaping Use: Never used  Substance Use Topics  . Alcohol use: Yes    Comment: Daily  . Drug use: Not Currently   Allergies Patient has no known allergies.  Review of Systems Review of Systems  Unable to perform ROS: Psychiatric disorder    Physical Exam Vital Signs  I have reviewed the triage vital signs BP 107/68   Pulse 64   Temp 98.2 F (36.8 C) (Oral)   Resp 20   SpO2 100%   Physical Exam Vitals reviewed.  Constitutional:      General: He is not in acute distress.    Appearance: He is well-developed. He is not diaphoretic.  HENT:     Head: Normocephalic and atraumatic.     Jaw: No trismus.     Right Ear: External ear normal.     Left Ear: External ear normal.     Nose: Nose normal.  Eyes:     General: No scleral icterus.    Conjunctiva/sclera: Conjunctivae normal.  Neck:     Trachea: Phonation normal.  Cardiovascular:     Rate and Rhythm: Normal rate and regular rhythm.  Pulmonary:     Effort: Pulmonary effort  is normal. No respiratory distress.     Breath sounds: No stridor.  Abdominal:     General: There is no distension.  Musculoskeletal:        General: Normal range of motion.     Right hand: Laceration (multiple <1 cm) present. No tenderness or bony tenderness. Normal strength. Normal sensation. Normal pulse.     Cervical back: Normal range of motion.  Neurological:     Mental Status: He is alert and oriented to person, place, and time.  Psychiatric:        Behavior: Behavior normal.     ED Results and Treatments Labs (all labs ordered are listed, but only abnormal results are displayed) Labs Reviewed  COMPREHENSIVE METABOLIC PANEL - Abnormal; Notable for the following components:      Result Value   Glucose, Bld 108 (*)    Total Protein 8.6 (*)    ALT 50 (*)    All other components within normal limits  ETHANOL - Abnormal; Notable for the following components:   Alcohol, Ethyl (B) 277 (*)    All other components within normal limits  CBC WITH DIFFERENTIAL/PLATELET - Abnormal; Notable for the following components:   Hemoglobin 17.4 (*)    Lymphs Abs 5.1 (*)    All other components within normal limits  RESP PANEL BY RT-PCR (FLU A&B, COVID) ARPGX2  RAPID URINE DRUG SCREEN, HOSP PERFORMED                                                                                                                         EKG  EKG Interpretation  Date/Time:  Sunday Sep 23 2020 00:39:28 EDT Ventricular Rate:  131 PR Interval:  131 QRS Duration: 89 QT Interval:  312 QTC Calculation: 461 R Axis:   61 Text Interpretation: Sinus tachycardia Borderline T abnormalities, inferior leads No old tracing to compare Confirmed by Drema Pryardama, Jonnae Fonseca [phone]([id number]) on 09/23/2020 1:40:10 AM      Radiology DG Hand 2 View Right  Result Date: 09/23/2020 CLINICAL DATA:  Hand laceration. EXAM: RIGHT HAND - 2 VIEW COMPARISON:  None. FINDINGS: There is no evidence of fracture or dislocation. There is no evidence of  arthropathy or other focal bone abnormality. Two adjacent radiopaque densities in the middle finger radial aspect adjacent to the middle phalanx consistent with foreign bodies both measuring 3-4 mm. No definite associated soft tissue air, acuity is uncertain. IMPRESSION: Two adjacent radiopaque densities in the middle finger soft tissues consistent with foreign bodies, acuity uncertain. No adjacent soft tissue air. No osseous abnormality. Electronically Signed   By: Narda RutherfordMelanie  Sanford M.D.   On: 09/23/2020 02:13   DG Hand 2 View Left  Result Date: 09/23/2020 CLINICAL DATA:  Hand laceration. EXAM: LEFT HAND - 2 VIEW COMPARISON:  Wrist radiograph 08/27/2018 FINDINGS: There is no evidence of acute fracture or dislocation. Scaphoid screw without adjacent lucency. Nonunion of remote ulna styloid fracture. No radiopaque foreign body or tracking soft tissue air. Site of laceration not well delineated. IMPRESSION: 1. No acute fracture or radiopaque foreign body. 2. Scaphoid screw without radiographic evidence of loosening. Remote ulna styloid fracture. Electronically Signed   By: Narda RutherfordMelanie  Sanford M.D.   On: 09/23/2020 02:11    Pertinent labs & imaging results that were available during my care of the patient were reviewed by me and considered in my medical decision making (see chart for details).  Medications Ordered in ED Medications  cephALEXin (KEFLEX) capsule 250 mg (has no administration in time range)  LORazepam (ATIVAN) injection 2 mg (2 mg Intramuscular Given 09/23/20 0017)  ziprasidone (GEODON) injection 20 mg (20 mg Intramuscular Given 09/23/20 0117)  sterile water (preservative free) injection (10 mLs  Given 09/23/20 0117)  lidocaine (XYLOCAINE) 2 % (with pres) injection (  Given 09/23/20 0443)                                                                                                                                    Procedures .Foreign Body Removal  Date/Time: 09/23/2020 5:18 AM Performed by:  Nira Connardama, Vinessa Macconnell Eduardo, MD Authorized by: Nira Connardama, Janyiah Silveri Eduardo, MD  Consent: The procedure was performed in an emergent situation. Patient understanding: patient states understanding of the procedure being performed Patient identity confirmed: arm band Body area: skin General location: upper extremity Location details: right long finger  Sedation: Patient sedated: yes Sedatives: see MAR for details Vitals: Vital signs were monitored during sedation.  Localization method: probed Removal mechanism: forceps Dressing: dressing applied Depth: deep Complexity: simple 1 objects recovered. Objects recovered: glass Post-procedure assessment: residual foreign bodies  remain .Critical Care Performed by: Nira Conn, MD Authorized by: Nira Conn, MD   Critical care provider statement:    Critical care time (minutes):  45   Critical care was time spent personally by me on the following activities:  Discussions with consultants, evaluation of patient's response to treatment, examination of patient, ordering and performing treatments and interventions, ordering and review of laboratory studies, ordering and review of radiographic studies, pulse oximetry, re-evaluation of patient's condition, obtaining history from patient or surrogate and review of old charts    (including critical care time)  Medical Decision Making / ED Course I have reviewed the nursing notes for this encounter and the patient's prior records (if available in EHR or on provided paperwork).   Jeffery L Goheen was evaluated in Emergency Department on 09/23/2020 for the symptoms described in the history of present illness. He was evaluated in the context of the global COVID-19 pandemic, which necessitated consideration that the patient might be at risk for infection with the SARS-CoV-2 virus that causes COVID-19. Institutional protocols and algorithms that pertain to the evaluation of patients at risk  for COVID-19 are in a state of rapid change based on information released by regulatory bodies including the CDC and federal and state organizations. These policies and algorithms were followed during the patient's care in the ED.  IVC upheld. First look papers filled out Patient required chemical sedation due to uncooperativeness and aggression. Once patient was sedated, labs and imaging obtained. Labs reassuring. X-rays without evidence of acute fracture or dislocation.  Patient does have retained foreign bodies in the right middle finger. I was able to retrieve 1 piece of glass. Lacerations were thoroughly irrigated. Will allow for secondary closure. Will provide patient with prophylactic antibiotics. Patient is intoxicated and sedated.  Once awake he will be cleared for behavioral health disposition.      Final Clinical Impression(s) / ED Diagnoses Final diagnoses:  Alcohol abuse  Aggressive behavior  Multiple lacerations      This chart was dictated using voice recognition software.  Despite best efforts to proofread,  errors can occur which can change the documentation meaning.   Nira Conn, MD 09/23/20 [phone]

## 2020-09-23 NOTE — ED Notes (Signed)
TTS machine to bedside at this time

## 2020-09-23 NOTE — ED Notes (Signed)
Patient provided with breakfast tray at this time.

## 2020-09-23 NOTE — ED Notes (Signed)
Pt given meal tray at this time 

## 2020-09-23 NOTE — ED Notes (Signed)
Pt. In burgundy scrubs. Pt. Has 1 belongings bag locked up in cabinet 9-12 at the nurses station. Pt. Has 1 pr. Black flip flops, 1 cell phone, 1 gray short and 1 pr. White socks. Pt. Has no wallet.

## 2020-09-23 NOTE — ED Notes (Signed)
Pt is awake, and very agitated and keeps balling his fists up and states he has things to do and needs to leave. MD aware

## 2020-09-23 NOTE — ED Notes (Addendum)
Bilateral ankle violent restraint are off, patient sleeping prone

## 2020-09-23 NOTE — Progress Notes (Signed)
Per Vivien Presto, patient meets criteria for inpatient treatment. There are no available or appropriate beds at [facility] today. CSW faxed referrals to the following facilities for review:   [facility]  TTS will continue to seek bed placement.  Crissie Reese, MSW, LCSW-A, LCAS-A Phone: [phone] Disposition/TOC

## 2020-09-23 NOTE — ED Notes (Signed)
Pt in bed resting. Pt has been cooperative most of the shift. Pt eating and taking meds with no issues.

## 2020-09-23 NOTE — ED Notes (Signed)
Right wrist violent restraint off

## 2020-09-23 NOTE — ED Provider Notes (Signed)
Assessed patient at bedside.  He is requesting to leave.  I redirected patient he is agreeable to staying.  He is under IVC presently.  But is still agreeable to staying voluntarily.  We will provide patient with 2 mg of p.o. Ativan.  He was given Geodon and Ativan earlier today.  Hopefully Ativan alone and via p.o. route will be sufficient to help him relax but not over sedate him.   [place], [place] 09/23/20 1151    Arby Barrette, MD 09/28/20 [phone]
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
## 2020-09-24 NOTE — Discharge Instructions (Addendum)
Follow-up as instructed by behavioral health 

## 2020-09-24 NOTE — ED Notes (Signed)
Per MD Zammit, pt was okay to be discharged home. Pt verbalized understanding to follow up with [facility] as instructed.

## 2020-09-24 NOTE — ED Notes (Signed)
Pt given meal tray.

## 2020-09-24 NOTE — BH Assessment (Signed)
[facility] Assessment Progress Note  Per Hillery Jacks, NP, this pt does not require psychiatric hospitalization at this time.  Pt presents under IVC initiated by pt's brother and upheld by EDP Drema Pry, MD, which has been rescinded by Nelly Rout, MD.  Pt is psychiatrically cleared.  Pt was discharged before this writer could enter discharge instructions.  EDP Bethann Berkshire, MD and pt's nurse, Garald Balding, are aware of this.  Doylene Canning, MA Triage Specialist [phone]

## 2022-07-15 IMAGING — DX DG HAND 2V*L*
2 series · 2 of 2 positions shown · non-contrast
Comparison: Wrist radiograph 08/27/2018

CLINICAL DATA: Hand laceration.

EXAM:
LEFT HAND - 2 VIEW

[hand ap]
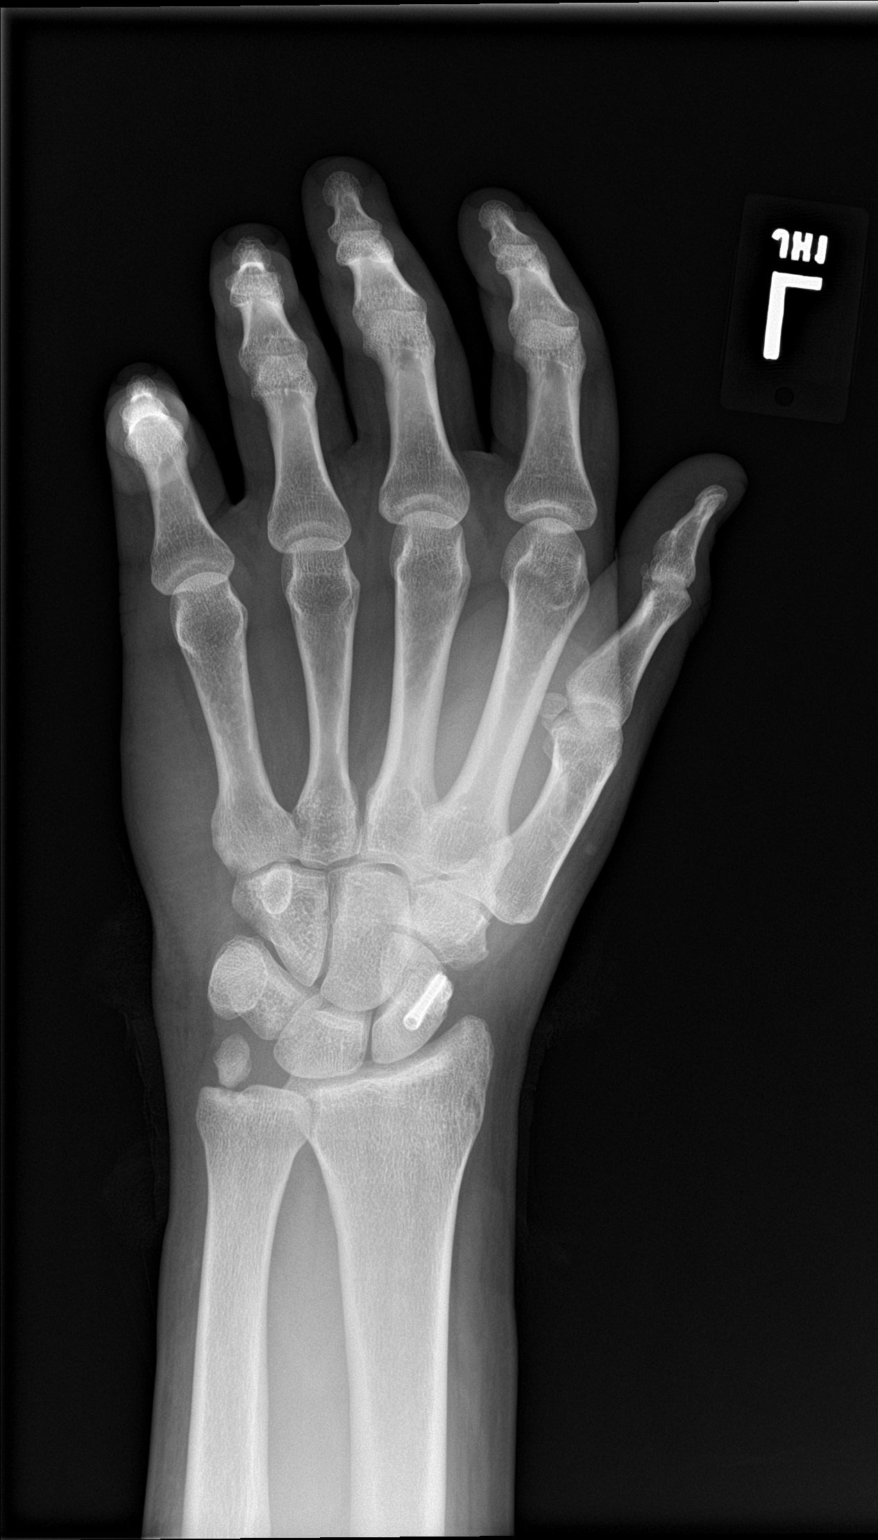

[hand lat]
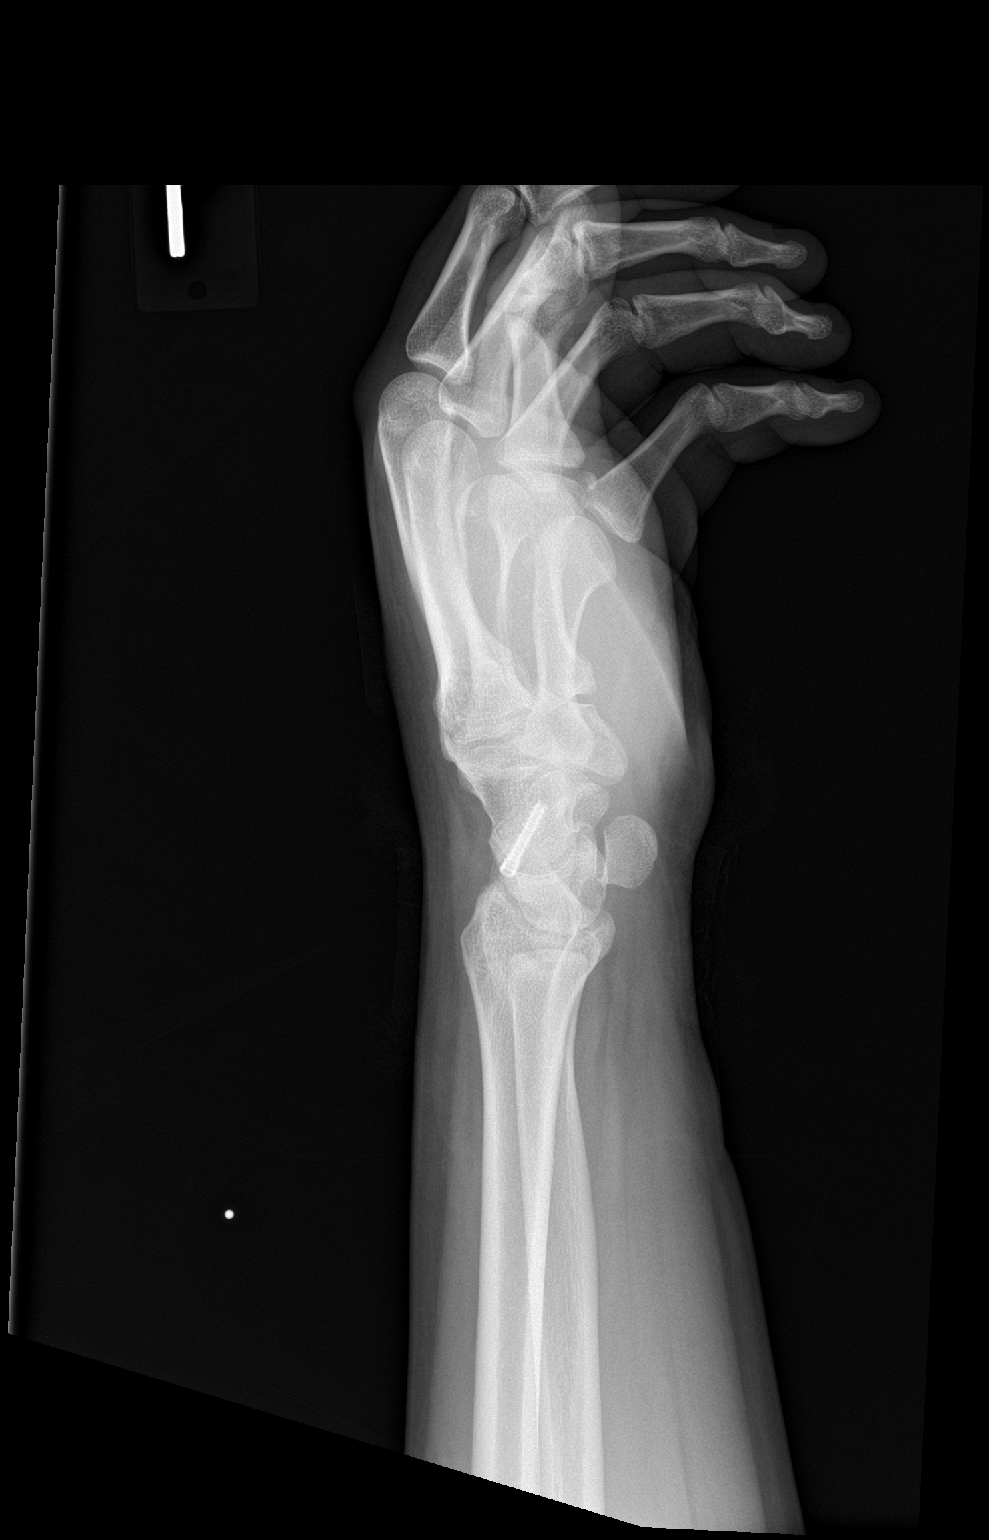

[2 of 2 positions shown; findings below may reference images not displayed]

FINDINGS: There is no evidence of acute fracture or dislocation. Scaphoid
screw without adjacent lucency. Nonunion of remote ulna styloid
fracture. No radiopaque foreign body or tracking soft tissue air.
Site of laceration not well delineated.
IMPRESSION: 1. No acute fracture or radiopaque foreign body.
2. Scaphoid screw without radiographic evidence of loosening. Remote
ulna styloid fracture.
